# Patient Record
Sex: Male | Born: 1935
Health system: Southern US, Community
[De-identification: ages and names within clinical notes are randomized; demographics above are authoritative.]

## PROBLEM LIST (undated history)

## (undated) DIAGNOSIS — N200 Calculus of kidney: Secondary | ICD-10-CM

## (undated) DIAGNOSIS — M109 Gout, unspecified: Secondary | ICD-10-CM

## (undated) DIAGNOSIS — I219 Acute myocardial infarction, unspecified: Secondary | ICD-10-CM

## (undated) DIAGNOSIS — I10 Essential (primary) hypertension: Secondary | ICD-10-CM

## (undated) DIAGNOSIS — I499 Cardiac arrhythmia, unspecified: Secondary | ICD-10-CM

## (undated) DIAGNOSIS — C61 Malignant neoplasm of prostate: Secondary | ICD-10-CM

## (undated) HISTORY — PX: CARDIAC DEFIBRILLATOR PLACEMENT: SHX171

## (undated) HISTORY — PX: SHOULDER SURGERY: SHX246

## (undated) HISTORY — PX: PACEMAKER INSERTION: SHX728

---

## 2012-03-12 ENCOUNTER — Emergency Department (HOSPITAL_COMMUNITY)
Admission: EM | Admit: 2012-03-12 | Discharge: 2012-03-13 | Disposition: A | Discharge status: Home / Self Care | Payer: Medicare Other | Attending: Emergency Medicine | Admitting: Emergency Medicine

## 2012-03-12 ENCOUNTER — Encounter (HOSPITAL_COMMUNITY): Payer: Self-pay | Admitting: Family Medicine

## 2012-03-12 DIAGNOSIS — R918 Other nonspecific abnormal finding of lung field: Secondary | ICD-10-CM

## 2012-03-12 DIAGNOSIS — K5900 Constipation, unspecified: Secondary | ICD-10-CM

## 2012-03-12 DIAGNOSIS — Z95810 Presence of automatic (implantable) cardiac defibrillator: Secondary | ICD-10-CM | POA: Insufficient documentation

## 2012-03-12 DIAGNOSIS — I2510 Atherosclerotic heart disease of native coronary artery without angina pectoris: Secondary | ICD-10-CM | POA: Insufficient documentation

## 2012-03-12 DIAGNOSIS — I10 Essential (primary) hypertension: Secondary | ICD-10-CM | POA: Insufficient documentation

## 2012-03-12 DIAGNOSIS — Z8546 Personal history of malignant neoplasm of prostate: Secondary | ICD-10-CM | POA: Insufficient documentation

## 2012-03-12 DIAGNOSIS — R1032 Left lower quadrant pain: Secondary | ICD-10-CM | POA: Insufficient documentation

## 2012-03-12 DIAGNOSIS — D649 Anemia, unspecified: Secondary | ICD-10-CM | POA: Insufficient documentation

## 2012-03-12 DIAGNOSIS — R911 Solitary pulmonary nodule: Secondary | ICD-10-CM | POA: Insufficient documentation

## 2012-03-12 DIAGNOSIS — Z7901 Long term (current) use of anticoagulants: Secondary | ICD-10-CM | POA: Insufficient documentation

## 2012-03-12 DIAGNOSIS — N289 Disorder of kidney and ureter, unspecified: Secondary | ICD-10-CM | POA: Insufficient documentation

## 2012-03-12 DIAGNOSIS — D696 Thrombocytopenia, unspecified: Secondary | ICD-10-CM

## 2012-03-12 DIAGNOSIS — I252 Old myocardial infarction: Secondary | ICD-10-CM | POA: Insufficient documentation

## 2012-03-12 DIAGNOSIS — Z79899 Other long term (current) drug therapy: Secondary | ICD-10-CM | POA: Insufficient documentation

## 2012-03-12 HISTORY — DX: Essential (primary) hypertension: I10

## 2012-03-12 HISTORY — DX: Malignant neoplasm of prostate: C61

## 2012-03-12 LAB — COMPREHENSIVE METABOLIC PANEL
AST: 39 U/L — ABNORMAL HIGH (ref 0–37)
Albumin: 3.7 g/dL (ref 3.5–5.2)
Alkaline Phosphatase: 64 U/L (ref 39–117)
BUN: 43 mg/dL — ABNORMAL HIGH (ref 6–23)
Chloride: 102 mEq/L (ref 96–112)
Potassium: 4.9 mEq/L (ref 3.5–5.1)
Sodium: 136 mEq/L (ref 135–145)
Total Bilirubin: 0.2 mg/dL — ABNORMAL LOW (ref 0.3–1.2)
Total Protein: 7.3 g/dL (ref 6.0–8.3)

## 2012-03-12 LAB — URINALYSIS, ROUTINE W REFLEX MICROSCOPIC
Bilirubin Urine: NEGATIVE
Glucose, UA: NEGATIVE mg/dL
Ketones, ur: NEGATIVE mg/dL
Nitrite: NEGATIVE
Protein, ur: NEGATIVE mg/dL
Specific Gravity, Urine: 1.014 (ref 1.005–1.030)
Urobilinogen, UA: 0.2 mg/dL (ref 0.0–1.0)
pH: 5.5 (ref 5.0–8.0)

## 2012-03-12 LAB — CBC WITH DIFFERENTIAL/PLATELET
Basophils Absolute: 0 10*3/uL (ref 0.0–0.1)
Basophils Relative: 0 % (ref 0–1)
Eosinophils Absolute: 0.1 10*3/uL (ref 0.0–0.7)
Hemoglobin: 11.9 g/dL — ABNORMAL LOW (ref 13.0–17.0)
MCH: 24.2 pg — ABNORMAL LOW (ref 26.0–34.0)
MCHC: 31.7 g/dL (ref 30.0–36.0)
Neutro Abs: 4.5 10*3/uL (ref 1.7–7.7)
Neutrophils Relative %: 67 % (ref 43–77)
Platelets: 73 10*3/uL — ABNORMAL LOW (ref 150–400)
RDW: 16.4 % — ABNORMAL HIGH (ref 11.5–15.5)

## 2012-03-12 LAB — POCT I-STAT TROPONIN I

## 2012-03-12 LAB — URINE MICROSCOPIC-ADD ON

## 2012-03-12 LAB — LIPASE, BLOOD: Lipase: 61 U/L — ABNORMAL HIGH (ref 11–59)

## 2012-03-12 MED ORDER — FENTANYL CITRATE 0.05 MG/ML IJ SOLN
50.0000 ug | Freq: Once | INTRAMUSCULAR | Status: AC
  Administered 2012-03-12: 50 ug via INTRAVENOUS
  Filled 2012-03-12: qty 2

## 2012-03-12 MED ORDER — SODIUM CHLORIDE 0.9 % IV BOLUS (SEPSIS)
500.0000 mL | Freq: Once | INTRAVENOUS | Status: AC
  Administered 2012-03-12: 500 mL via INTRAVENOUS

## 2012-03-12 MED ORDER — SODIUM CHLORIDE 0.9 % IV SOLN: INTRAVENOUS | Status: DC

## 2012-03-12 NOTE — ED Notes (Signed)
Pt finished oral CT contrast.  CT notified.

## 2012-03-12 NOTE — ED Provider Notes (Signed)
History     CSN: [id number]  Arrival date & time 03/12/12  7829   First MD Initiated Contact with Patient 03/12/12 2124      Chief Complaint  Patient presents with  . Constipation    (Consider location/radiation/quality/duration/timing/severity/associated sxs/prior treatment) HPI 76 year old male complains of abdominal pain and constipation. He states the last couple of months instead of having one bowel movement per day he started having one about every few days. The last week he has not had good bowel movements at all and has been only able to pass several spells of tiny little rabbit pellets sized stools. He tried a laxative a few weeks ago and had a good bowel movement after that. He tried laxatives this week over-the-counter not working. He has 2 days of left lower quadrant mild but constant waxing and waning abdominal pain without nausea or vomiting diarrhea or bloody stools. He is no chest pain cough or shortness breath. Treatment prior to arrival was over-the-counter laxatives without improvement.  Pain gradually worsening mild-moderate. Past Medical History  Diagnosis Date  . Prostate cancer   . Hypertension   CAD, MI, ICD  Past Surgical History  Procedure Date  . Cardiac defibrillator placement   ICD, prostate seed implants  No family history on file.  History  Substance Use Topics  . Smoking status: Never Smoker   . Smokeless tobacco: Not on file  . Alcohol Use: No      Review of Systems 10 Systems reviewed and are negative for acute change except as noted in the HPI. Allergies  Ibuprofen  Home Medications   Current Outpatient Rx  Name  Route  Sig  Dispense  Refill  . ALLOPURINOL 300 MG PO TABS   Oral   Take 300 mg by mouth daily.         Marland Kitchen BISACODYL 5 MG PO TBEC   Oral   Take 5 mg by mouth daily as needed. For constipation         . COLCHICINE 0.6 MG PO TABS   Oral   Take 0.6 mg by mouth 2 (two) times daily.         Marland Kitchen DOXAZOSIN MESYLATE 4  MG PO TABS   Oral   Take 4 mg by mouth at bedtime.         . FOSINOPRIL SODIUM 20 MG PO TABS   Oral   Take 20 mg by mouth daily.         Marland Kitchen GABAPENTIN 300 MG PO CAPS   Oral   Take 300 mg by mouth 3 (three) times daily.         Marland Kitchen GLYCERIN (LAXATIVE) 2 G RE SUPP   Rectal   Place 1 suppository rectally daily as needed. For constipation         . HYDROCHLOROTHIAZIDE 25 MG PO TABS   Oral   Take 25 mg by mouth daily.         Marland Kitchen METOPROLOL TARTRATE 50 MG PO TABS   Oral   Take 50 mg by mouth daily.         Marland Kitchen OVER THE COUNTER MEDICATION   Oral   Take 2-4 capsules by mouth daily with breakfast. "LBS II" (lower bowel stimulant"         . POTASSIUM CHLORIDE ER 10 MEQ PO TBCR   Oral   Take 10 mEq by mouth daily.         . TORSEMIDE 20 MG PO TABS  Oral   Take 20 mg by mouth daily.         . WARFARIN SODIUM 5 MG PO TABS   Oral   Take 7.5 mg by mouth daily.         Marland Kitchen PEG 3350-KCL-NABCB-NACL-NASULF 236 G PO SOLR   Oral   Take 4,000 mLs by mouth once.   4000 mL   0     BP 133/71  Pulse 78  Temp 98 F (36.7 C) (Oral)  Resp 18  SpO2 96%  Physical Exam  Nursing note and vitals reviewed. Constitutional:       Awake, alert, nontoxic appearance.  HENT:  Head: Atraumatic.  Eyes: Right eye exhibits no discharge. Left eye exhibits no discharge.  Neck: Neck supple.  Cardiovascular: Normal rate and regular rhythm.   No murmur heard. Pulmonary/Chest: Effort normal and breath sounds normal. No respiratory distress. He has no wheezes. He has no rales. He exhibits no tenderness.  Abdominal: Soft. Bowel sounds are normal. He exhibits distension. He exhibits no mass. There is tenderness. There is no rebound and no guarding.       Abdomen mildly distended but soft with mild left lower quadrant tenderness without rebound  Genitourinary:       Testicles are nontender, no palpable inguinal hernias or inguinal tenderness, rectal examination nontender with light brown  small amount of stool without fecal impaction appreciated  Musculoskeletal: He exhibits no tenderness.       Baseline ROM, no obvious new focal weakness.  Neurological: He is alert.       Mental status and motor strength appears baseline for patient and situation.  Skin: No rash noted.  Psychiatric: He has a normal mood and affect.    ED Course  Procedures (including critical care time)  Labs Reviewed  CBC WITH DIFFERENTIAL - Abnormal; Notable for the following:    Hemoglobin 11.9 (*)     HCT 37.5 (*)     MCV 76.2 (*)     MCH 24.2 (*)     RDW 16.4 (*)     Platelets 73 (*)  PLATELET COUNT CONFIRMED BY SMEAR   All other components within normal limits  COMPREHENSIVE METABOLIC PANEL - Abnormal; Notable for the following:    Glucose, Bld 109 (*)     BUN 43 (*)     Creatinine, Ser 1.79 (*)     AST 39 (*)     Total Bilirubin 0.2 (*)     GFR calc non Af Amer 36 (*)     GFR calc Af Amer 42 (*)     All other components within normal limits  LIPASE, BLOOD - Abnormal; Notable for the following:    Lipase 61 (*)     All other components within normal limits  URINALYSIS, ROUTINE W REFLEX MICROSCOPIC - Abnormal; Notable for the following:    Hgb urine dipstick TRACE (*)     Leukocytes, UA TRACE (*)     All other components within normal limits  POCT I-STAT TROPONIN I  URINE MICROSCOPIC-ADD ON  LAB REPORT - SCANNED   Ct Abdomen Pelvis W Contrast  03/13/2012  *RADIOLOGY REPORT*  Clinical Data: Left lower quadrant abdominal pain and constipation. History of prostate cancer.  CT ABDOMEN AND PELVIS WITH CONTRAST  Technique:  Multidetector CT imaging of the abdomen and pelvis was performed following the standard protocol during bolus administration of intravenous contrast.  Contrast: 80mL OMNIPAQUE IOHEXOL 300 MG/ML  SOLN  Comparison: None.  Findings:  Three nodules are demonstrated in the right lung base, measuring up to 6 mm diameter. Six follow-up CT is recommended.  The liver, spleen,  gallbladder, pancreas, adrenal glands, and retroperitoneal lymph nodes are unremarkable.  Calcification of the aorta without aneurysm.  Multiple parenchymal cysts of the kidneys without solid mass or hydronephrosis.  The stomach, small bowel, and colon are not abnormally distended and no wall thickening is identified.  No free air or free fluid in the abdomen.  Small amount of fat in the umbilicus.  Pelvis:  Seed implants in the prostate gland.  Bladder wall is not thickened.  Pelvic lymph nodes are not pathologically enlarged. The appendix is normal.  No diverticulitis.  Degenerative changes in the lumbar spine.  No sclerotic bone lesions are appreciated.  IMPRESSION: Right lung nodules measuring up to 6 mm diameter.  28-month follow- up study recommended.  No acute process demonstrated in the abdomen or pelvis.   Original Report Authenticated By: Burman Nieves, M.D.      1. Abdominal pain, left lower quadrant   2. Constipation   3. Lung nodule, multiple   4. Renal insufficiency   5. Thrombocytopenia   6. Anemia       MDM  Pt stable in ED with no significant deterioration in condition.  Patient / Family / Caregiver informed of clinical course, understand medical decision-making process, and agree with plan.  I doubt any other EMC precluding discharge at this time including, but not necessarily limited to the following:SBI.         Hurman Horn, MD 03/14/12 1714

## 2012-03-12 NOTE — ED Notes (Signed)
Per pt sts he hasn't had a normal BM in 1 week. sts generalized abdominal pain and nausea. sts also some lower abdominal pain

## 2012-03-13 ENCOUNTER — Emergency Department (HOSPITAL_COMMUNITY): Payer: Medicare Other

## 2012-03-13 ENCOUNTER — Encounter (HOSPITAL_COMMUNITY): Payer: Self-pay | Admitting: Radiology

## 2012-03-13 MED ORDER — IOHEXOL 300 MG/ML  SOLN
80.0000 mL | Freq: Once | INTRAMUSCULAR | Status: AC | PRN
  Administered 2012-03-13: 80 mL via INTRAVENOUS

## 2012-03-13 MED ORDER — PEG 3350-KCL-NABCB-NACL-NASULF 236 G PO SOLR: 4.0000 L | Freq: Once | ORAL | Status: AC

## 2013-06-26 ENCOUNTER — Emergency Department (HOSPITAL_BASED_OUTPATIENT_CLINIC_OR_DEPARTMENT_OTHER)
Admission: EM | Admit: 2013-06-26 | Discharge: 2013-06-26 | Disposition: A | Discharge status: Home / Self Care | Payer: Medicare HMO | Attending: Emergency Medicine | Admitting: Emergency Medicine

## 2013-06-26 ENCOUNTER — Encounter (HOSPITAL_BASED_OUTPATIENT_CLINIC_OR_DEPARTMENT_OTHER): Payer: Self-pay | Admitting: Emergency Medicine

## 2013-06-26 ENCOUNTER — Emergency Department (HOSPITAL_BASED_OUTPATIENT_CLINIC_OR_DEPARTMENT_OTHER): Payer: Medicare HMO

## 2013-06-26 DIAGNOSIS — Z950 Presence of cardiac pacemaker: Secondary | ICD-10-CM | POA: Insufficient documentation

## 2013-06-26 DIAGNOSIS — S46009A Unspecified injury of muscle(s) and tendon(s) of the rotator cuff of unspecified shoulder, initial encounter: Secondary | ICD-10-CM

## 2013-06-26 DIAGNOSIS — Z9889 Other specified postprocedural states: Secondary | ICD-10-CM | POA: Insufficient documentation

## 2013-06-26 DIAGNOSIS — M25512 Pain in left shoulder: Secondary | ICD-10-CM

## 2013-06-26 DIAGNOSIS — I252 Old myocardial infarction: Secondary | ICD-10-CM | POA: Insufficient documentation

## 2013-06-26 DIAGNOSIS — Z95810 Presence of automatic (implantable) cardiac defibrillator: Secondary | ICD-10-CM | POA: Insufficient documentation

## 2013-06-26 DIAGNOSIS — R58 Hemorrhage, not elsewhere classified: Secondary | ICD-10-CM | POA: Insufficient documentation

## 2013-06-26 DIAGNOSIS — Y929 Unspecified place or not applicable: Secondary | ICD-10-CM | POA: Insufficient documentation

## 2013-06-26 DIAGNOSIS — I10 Essential (primary) hypertension: Secondary | ICD-10-CM | POA: Insufficient documentation

## 2013-06-26 DIAGNOSIS — Z79899 Other long term (current) drug therapy: Secondary | ICD-10-CM | POA: Insufficient documentation

## 2013-06-26 DIAGNOSIS — Z8546 Personal history of malignant neoplasm of prostate: Secondary | ICD-10-CM | POA: Insufficient documentation

## 2013-06-26 DIAGNOSIS — M109 Gout, unspecified: Secondary | ICD-10-CM | POA: Insufficient documentation

## 2013-06-26 DIAGNOSIS — Y9389 Activity, other specified: Secondary | ICD-10-CM | POA: Insufficient documentation

## 2013-06-26 DIAGNOSIS — X58XXXA Exposure to other specified factors, initial encounter: Secondary | ICD-10-CM | POA: Insufficient documentation

## 2013-06-26 DIAGNOSIS — S43429A Sprain of unspecified rotator cuff capsule, initial encounter: Secondary | ICD-10-CM | POA: Insufficient documentation

## 2013-06-26 DIAGNOSIS — Z7901 Long term (current) use of anticoagulants: Secondary | ICD-10-CM | POA: Insufficient documentation

## 2013-06-26 HISTORY — DX: Cardiac arrhythmia, unspecified: I49.9

## 2013-06-26 HISTORY — DX: Gout, unspecified: M10.9

## 2013-06-26 HISTORY — DX: Acute myocardial infarction, unspecified: I21.9

## 2013-06-26 MED ORDER — TRAMADOL HCL 50 MG PO TABS: 50.0000 mg | ORAL_TABLET | Freq: Four times a day (QID) | ORAL | Status: AC | PRN

## 2013-06-26 NOTE — ED Provider Notes (Signed)
CSN: [id number]     Arrival date & time 06/26/13  1957 History   First MD Initiated Contact with Patient 06/26/13 2240     This chart was scribed for Ronald Kung, MD by Forrestine Him, ED Scribe. This patient was seen in room MH02/MH02 and the patient's care was started 10:44 PM.   Chief Complaint  Patient presents with  . Shoulder Pain   Patient is a 78 y.o. male presenting with shoulder pain. The history is provided by the patient. No language interpreter was used.  Shoulder Pain This is a new problem. The current episode started more than 2 days ago. The problem occurs constantly. The problem has not changed since onset.Pertinent negatives include no chest pain, no abdominal pain, no headaches and no shortness of breath. Nothing aggravates the symptoms. Nothing relieves the symptoms. He has tried nothing for the symptoms.   HPI Comments: Ronald Nicholson is a 78 y.o. dominant right handed male who presents to the Emergency Department complaining of anterior left sided shoulder pain that initially started 5 days ago after waking from sleep. Denies any recent injury or trauma, but reports having left shoulder surgery to repair a tare about 15 years ago. States the pain is exacerbated by all movement, and he denies any alleviating factors at this time. He states he has been using a belt as a sling for support, as he is unable to move his shoulder without pain. At this time she denies any fever, chills, congestion, rhinorrhea, sore throat, visual disturbances, cough, SOB, CP, HA, dysuria, abdominal pain, diarrhea, nausea, vomiting, joint swelling, rash, or confusion.Pt is currently on Coumadin 5 mg tablets. His PMHx includes prostate cancer, HTN, heart attack, and Gout.    [facility]  Past Medical History  Diagnosis Date  . Prostate cancer   . Hypertension   . Irregular heart rate   . Heart attack   . Gout    Past Surgical History  Procedure Laterality Date   . Cardiac defibrillator placement    . Pacemaker insertion     No family history on file. History  Substance Use Topics  . Smoking status: Never Smoker   . Smokeless tobacco: Never Used  . Alcohol Use: No    Review of Systems  Constitutional: Negative for fever and chills.  HENT: Negative for congestion, rhinorrhea and sore throat.   Eyes: Negative for visual disturbance.  Respiratory: Negative for cough and shortness of breath.   Cardiovascular: Negative for chest pain.  Gastrointestinal: Negative for nausea, vomiting, abdominal pain and diarrhea.  Genitourinary: Negative for dysuria.  Musculoskeletal: Positive for arthralgias (Left shoulder pain) and joint swelling. Negative for back pain and neck pain.  Skin: Negative for rash.  Neurological: Negative for headaches.  Hematological: Bruises/bleeds easily.  Psychiatric/Behavioral: Negative for confusion.      Allergies  Ibuprofen  Home Medications   Current Outpatient Rx  Name  Route  Sig  Dispense  Refill  . allopurinol (ZYLOPRIM) 300 MG tablet   Oral   Take 300 mg by mouth daily.         . bisacodyl (DULCOLAX) 5 MG EC tablet   Oral   Take 5 mg by mouth daily as needed. For constipation         . colchicine 0.6 MG tablet   Oral   Take 0.6 mg by mouth 2 (two) times daily.         Marland Kitchen doxazosin (CARDURA) 4 MG  tablet   Oral   Take 4 mg by mouth at bedtime.         . fosinopril (MONOPRIL) 20 MG tablet   Oral   Take 20 mg by mouth daily.         Marland Kitchen gabapentin (NEURONTIN) 300 MG capsule   Oral   Take 300 mg by mouth 3 (three) times daily.         Marland Kitchen glycerin adult (GLYCERIN ADULT) 2 G SUPP   Rectal   Place 1 suppository rectally daily as needed. For constipation         . hydrochlorothiazide (HYDRODIURIL) 25 MG tablet   Oral   Take 25 mg by mouth daily.         . metoprolol (LOPRESSOR) 50 MG tablet   Oral   Take 50 mg by mouth daily.         Marland Kitchen OVER THE COUNTER MEDICATION   Oral    Take 2-4 capsules by mouth daily with breakfast. "LBS II" (lower bowel stimulant"         . potassium chloride (K-DUR) 10 MEQ tablet   Oral   Take 10 mEq by mouth daily.         Marland Kitchen torsemide (DEMADEX) 20 MG tablet   Oral   Take 20 mg by mouth daily.         Marland Kitchen warfarin (COUMADIN) 5 MG tablet   Oral   Take 7.5 mg by mouth daily.         . polyethylene glycol (GOLYTELY) 236 G solution   Oral   Take 4,000 mLs by mouth once.   4000 mL   0   . traMADol (ULTRAM) 50 MG tablet   Oral   Take 1 tablet (50 mg total) by mouth every 6 (six) hours as needed.   15 tablet   0    Triage VitalsBP 139/63  Pulse 73  Temp(Src) 98.5 F (36.9 C) (Oral)  Resp 20  Wt 230 lb (104.327 kg)  SpO2 95%  Physical Exam  Nursing note and vitals reviewed. Constitutional: He is oriented to person, place, and time. He appears well-developed and well-nourished.  HENT:  Head: Normocephalic and atraumatic.  Eyes: EOM are normal.  Neck: Normal range of motion.  Cardiovascular: Normal rate, regular rhythm and normal heart sounds.   Pulses:      Radial pulses are 2+ on the left side.  Capillary refill 1 second  Pulmonary/Chest: Effort normal and breath sounds normal.  Abdominal: Soft. Bowel sounds are normal.  Musculoskeletal: Normal range of motion. He exhibits tenderness.  Tenderness to anterior left shoulder  Neurological: He is alert and oriented to person, place, and time.  Skin: Skin is warm and dry.  Psychiatric: He has a normal mood and affect. His behavior is normal.    ED Course  Procedures (including critical care time)  DIAGNOSTIC STUDIES: Oxygen Saturation is 95% on RA, Adequate by my interpretation.    COORDINATION OF CARE: 10:54 PM- Will give Tramadol. Discussed treatment plan with pt at bedside and pt agreed to plan.     Labs Review Labs Reviewed - No data to display Imaging Review Dg Shoulder Left  06/26/2013   CLINICAL DATA:  Left shoulder pain  EXAM: LEFT SHOULDER -  2+ VIEW  COMPARISON:  DG SHOULDER 2+V*L* dated 06/23/2013  FINDINGS: The left glenohumeral joint is intact. There is calcific tendinitis again demonstrated along the humeral head. Pacemaker in place.  IMPRESSION: 1. No acute  findings. 2. Calcific tendinitis.   Electronically Signed   By: Suzy Bouchard M.D.   On: 06/26/2013 20:55    EKG Interpretation   None       MDM   Final diagnoses:  Shoulder pain, left  Rotator cuff injury    Patient with old left rotator cuff injury with surgery many years ago. Today symptoms consistent with a recurrent injury the rotator cuff. She was shoulder immobilizer and followup with sports medicine. X-rays negative for dislocation or fracture.  I personally performed the services described in this documentation, which was scribed in my presence. The recorded information has been reviewed and is accurate.    Ronald Kung, MD 06/26/13 2308

## 2013-06-26 NOTE — ED Notes (Signed)
Pt reports left shoulder pain since Tuesday- unable to move it- using belt as a sling- denies known injury

## 2013-06-26 NOTE — ED Notes (Signed)
Left shoulder immobilizer placed by ED tech per EDP order.

## 2013-06-26 NOTE — Discharge Instructions (Signed)
Wear the shoulder immobilizer on the left side for your comfort. Can take it off for showers. Move your arm left and right low that do not lifted up. Give the sports medicine or call for followup. Take the tramadol as needed for pain. Most likely just the arm in the sling will be very helpful to help control the pain.

## 2013-06-29 ENCOUNTER — Encounter: Payer: Self-pay | Admitting: Family Medicine

## 2013-06-29 ENCOUNTER — Ambulatory Visit (INDEPENDENT_AMBULATORY_CARE_PROVIDER_SITE_OTHER): Payer: Medicare HMO | Admitting: Family Medicine

## 2013-06-29 VITALS — BP 133/76 | HR 65 | Ht 69.0 in | Wt 224.0 lb

## 2013-06-29 DIAGNOSIS — M25519 Pain in unspecified shoulder: Secondary | ICD-10-CM

## 2013-06-29 DIAGNOSIS — M25512 Pain in left shoulder: Secondary | ICD-10-CM

## 2013-06-29 NOTE — Patient Instructions (Signed)
You have calcific rotator cuff tendinitis. Try to avoid painful activities (overhead activities, lifting with extended arm) as much as possible. Start arm circles, arm swings, table slides - 3 sets of 10 once a day. Subacromial injection may be beneficial to help with pain and to decrease inflammation - you were given this today. After about 5 days you can start the exercises with the band - 3 sets of 10 once a day. Follow up with me in 1 month. If not improving at follow-up we will consider further imaging, physical therapy and/or nitro patches.

## 2013-07-04 ENCOUNTER — Encounter: Payer: Self-pay | Admitting: Family Medicine

## 2013-07-04 DIAGNOSIS — M25512 Pain in left shoulder: Secondary | ICD-10-CM | POA: Insufficient documentation

## 2013-07-04 NOTE — Assessment & Plan Note (Signed)
2/2 calcific rotator cuff tendinitis, confirmed by radiographs.  Start home exercise program.  Subacromial injection today.  Start home exercise program in about 5 days.  Ok to start codman exercises now.  F/u in 1 month.  After informed written consent, patient was seated on exam table. Left shoulder was prepped with alcohol swab and utilizing posterior approach, patient's left subacromial space was injected with 3:1 marcaine: depomedrol. Patient tolerated the procedure well without immediate complications.

## 2013-07-04 NOTE — Progress Notes (Signed)
Patient ID: Ronald Nicholson, male   DOB: July 20, 1935, 78 y.o.   MRN: [id number]  PCP: Default, Provider, MD  Subjective:   HPI: Patient is a 78 y.o. male here for left shoulder pain.  Patient reports his left shoulder started hurtin on Tuesday without any trauma. Did have rotator cuff repair of this shoulder 15 years ago. Some swelling, no bruising. No change in activity level. Trouble sleeping. Hard to dress. Is right handed.  Past Medical History  Diagnosis Date  . Prostate cancer   . Hypertension   . Irregular heart rate   . Heart attack   . Gout     Current Outpatient Prescriptions on File Prior to Visit  Medication Sig Dispense Refill  . allopurinol (ZYLOPRIM) 300 MG tablet Take 300 mg by mouth daily.      . bisacodyl (DULCOLAX) 5 MG EC tablet Take 5 mg by mouth daily as needed. For constipation      . colchicine 0.6 MG tablet Take 0.6 mg by mouth 2 (two) times daily.      Marland Kitchen doxazosin (CARDURA) 4 MG tablet Take 4 mg by mouth at bedtime.      . fosinopril (MONOPRIL) 20 MG tablet Take 20 mg by mouth daily.      Marland Kitchen gabapentin (NEURONTIN) 300 MG capsule Take 300 mg by mouth 3 (three) times daily.      Marland Kitchen glycerin adult (GLYCERIN ADULT) 2 G SUPP Place 1 suppository rectally daily as needed. For constipation      . hydrochlorothiazide (HYDRODIURIL) 25 MG tablet Take 25 mg by mouth daily.      Marland Kitchen OVER THE COUNTER MEDICATION Take 2-4 capsules by mouth daily with breakfast. "LBS II" (lower bowel stimulant"      . polyethylene glycol (GOLYTELY) 236 G solution Take 4,000 mLs by mouth once.  4000 mL  0  . potassium chloride (K-DUR) 10 MEQ tablet Take 10 mEq by mouth daily.      Marland Kitchen torsemide (DEMADEX) 20 MG tablet Take 20 mg by mouth daily.      . traMADol (ULTRAM) 50 MG tablet Take 1 tablet (50 mg total) by mouth every 6 (six) hours as needed.  15 tablet  0  . warfarin (COUMADIN) 5 MG tablet Take 7.5 mg by mouth daily.       No current facility-administered medications on file prior to  visit.    Past Surgical History  Procedure Laterality Date  . Cardiac defibrillator placement    . Pacemaker insertion      Allergies  Allergen Reactions  . Ibuprofen Nausea And Vomiting    History   Social History  . Marital Status: Married    Spouse Name: N/A    Number of Children: N/A  . Years of Education: N/A   Occupational History  . Not on file.   Social History Main Topics  . Smoking status: Never Smoker   . Smokeless tobacco: Never Used  . Alcohol Use: No  . Drug Use: No  . Sexual Activity: Not on file   Other Topics Concern  . Not on file   Social History Narrative  . No narrative on file    Family History  Problem Relation Age of Onset  . Hypertension Mother   . Hypertension Father   . Hypertension Brother     BP 133/76  Pulse 65  Ht 5\' 9"  (1.753 m)  Wt 224 lb (101.606 kg)  BMI 33.06 kg/m2  Review of Systems: See HPI above.  Objective:  Physical Exam:  Gen: NAD  L shoulder: No swelling, ecchymoses.  No gross deformity. No TTP biceps tendon, AC joint. Abduction, flexion to 90 degrees.  ER to 30 degrees. Positive Hawkins, Neers. Negative Yergasons. Strength 4/5 with empty can, 5/5 with IR/ER but pain with all motions. Negative apprehension. NV intact distally.    Assessment & Plan:  1. Left shoulder pain - 2/2 calcific rotator cuff tendinitis, confirmed by radiographs.  Start home exercise program.  Subacromial injection today.  Start home exercise program in about 5 days.  Ok to start codman exercises now.  F/u in 1 month.  After informed written consent, patient was seated on exam table. Left shoulder was prepped with alcohol swab and utilizing posterior approach, patient's left subacromial space was injected with 3:1 marcaine: depomedrol. Patient tolerated the procedure well without immediate complications.

## 2013-12-01 ENCOUNTER — Emergency Department (HOSPITAL_BASED_OUTPATIENT_CLINIC_OR_DEPARTMENT_OTHER): Payer: Medicare HMO

## 2013-12-01 ENCOUNTER — Emergency Department (HOSPITAL_BASED_OUTPATIENT_CLINIC_OR_DEPARTMENT_OTHER)
Admission: EM | Admit: 2013-12-01 | Discharge: 2013-12-01 | Disposition: A | Discharge status: Home / Self Care | Payer: Medicare HMO | Attending: Emergency Medicine | Admitting: Emergency Medicine

## 2013-12-01 ENCOUNTER — Encounter (HOSPITAL_BASED_OUTPATIENT_CLINIC_OR_DEPARTMENT_OTHER): Payer: Self-pay | Admitting: Emergency Medicine

## 2013-12-01 DIAGNOSIS — Z8679 Personal history of other diseases of the circulatory system: Secondary | ICD-10-CM | POA: Diagnosis not present

## 2013-12-01 DIAGNOSIS — R51 Headache: Secondary | ICD-10-CM | POA: Insufficient documentation

## 2013-12-01 DIAGNOSIS — Z95810 Presence of automatic (implantable) cardiac defibrillator: Secondary | ICD-10-CM | POA: Insufficient documentation

## 2013-12-01 DIAGNOSIS — I10 Essential (primary) hypertension: Secondary | ICD-10-CM | POA: Diagnosis not present

## 2013-12-01 DIAGNOSIS — Z8546 Personal history of malignant neoplasm of prostate: Secondary | ICD-10-CM | POA: Diagnosis not present

## 2013-12-01 DIAGNOSIS — M109 Gout, unspecified: Secondary | ICD-10-CM | POA: Insufficient documentation

## 2013-12-01 DIAGNOSIS — Z7901 Long term (current) use of anticoagulants: Secondary | ICD-10-CM | POA: Diagnosis not present

## 2013-12-01 DIAGNOSIS — I252 Old myocardial infarction: Secondary | ICD-10-CM | POA: Diagnosis not present

## 2013-12-01 DIAGNOSIS — Z79899 Other long term (current) drug therapy: Secondary | ICD-10-CM | POA: Insufficient documentation

## 2013-12-01 DIAGNOSIS — R519 Headache, unspecified: Secondary | ICD-10-CM

## 2013-12-01 MED ORDER — HYDROCODONE-ACETAMINOPHEN 5-325 MG PO TABS
1.0000 | ORAL_TABLET | Freq: Once | ORAL | Status: AC
  Administered 2013-12-01: 1 via ORAL
  Filled 2013-12-01: qty 1

## 2013-12-01 NOTE — ED Provider Notes (Signed)
Medical screening examination/treatment/procedure(s) were conducted as a shared visit with non-physician practitioner(s) and myself.  I personally evaluated the patient during the encounter.   EKG Interpretation None      Patient is a 78 year old male with history of hypertension, cardiac disease status post pacemaker/defibrillator who presents emergency room with intermittent frontal throbbing headache is gradual onset that is in present for the past 2 months. No fevers. Head injury. No numbness, tingling or focal weakness. Patient states pain resolves with Excedrin and relaxing. He is neurologically intact on exam. Head CT negative. I feel he is safe to be discharged home. Have discussed return precautions. Patient and wife comfortable with plan.  [place], DO 12/01/13 1637

## 2013-12-01 NOTE — ED Notes (Addendum)
C/o HA x months-was seen by PCP with no relief-current HA started last week-constant to frontal-pt NAD-steady gait into triage

## 2013-12-01 NOTE — Discharge Instructions (Signed)

## 2013-12-01 NOTE — ED Provider Notes (Signed)
CSN: [id number]     Arrival date & time 12/01/13  1357 History   First MD Initiated Contact with Patient 12/01/13 1435     Chief Complaint  Patient presents with  . Headache     (Consider location/radiation/quality/duration/timing/severity/associated sxs/prior Treatment) Patient is a 78 y.o. male presenting with headaches. The history is provided by the patient. No language interpreter was used.  Headache Pain location:  Generalized Quality:  Sharp Associated symptoms: no fever, no nausea and no vomiting   Associated symptoms comment:  He reports headache that starts in the frontal area bilaterally and wraps around the entire head, somewhat worse on the right side. He has had the headache for the past 2 months, and states it is worse in the past 4 days. No fever at any time. No nausea, vomiting, ataxia, confusion or altered mental status, difficult speech.    Past Medical History  Diagnosis Date  . Prostate cancer   . Hypertension   . Irregular heart rate   . Heart attack   . Gout    Past Surgical History  Procedure Laterality Date  . Cardiac defibrillator placement    . Pacemaker insertion     Family History  Problem Relation Age of Onset  . Hypertension Mother   . Hypertension Father   . Hypertension Brother    History  Substance Use Topics  . Smoking status: Never Smoker   . Smokeless tobacco: Never Used  . Alcohol Use: No    Review of Systems  Constitutional: Negative for fever and chills.  Respiratory: Negative.   Cardiovascular: Negative.   Gastrointestinal: Negative.  Negative for nausea and vomiting.  Musculoskeletal: Negative.   Skin: Negative.   Neurological: Positive for headaches. Negative for speech difficulty and weakness.  Psychiatric/Behavioral: Negative for confusion.      Allergies  Ibuprofen  Home Medications   Prior to Admission medications   Medication Sig Start Date End Date Taking? Authorizing Provider  allopurinol (ZYLOPRIM) 300  MG tablet Take 300 mg by mouth daily.    Historical Provider, MD  bisacodyl (DULCOLAX) 5 MG EC tablet Take 5 mg by mouth daily as needed. For constipation    Historical Provider, MD  colchicine 0.6 MG tablet Take 0.6 mg by mouth 2 (two) times daily.    Historical Provider, MD  doxazosin (CARDURA) 4 MG tablet Take 4 mg by mouth at bedtime.    Historical Provider, MD  fosinopril (MONOPRIL) 20 MG tablet Take 20 mg by mouth daily.    Historical Provider, MD  gabapentin (NEURONTIN) 300 MG capsule Take 300 mg by mouth 3 (three) times daily.    Historical Provider, MD  glycerin adult (GLYCERIN ADULT) 2 G SUPP Place 1 suppository rectally daily as needed. For constipation    Historical Provider, MD  hydrochlorothiazide (HYDRODIURIL) 25 MG tablet Take 25 mg by mouth daily.    Historical Provider, MD  OVER THE COUNTER MEDICATION Take 2-4 capsules by mouth daily with breakfast. "LBS II" (lower bowel stimulant"    Historical Provider, MD  polyethylene glycol (GOLYTELY) 236 G solution Take 4,000 mLs by mouth once. 03/13/12   Babette Relic, MD  potassium chloride (K-DUR) 10 MEQ tablet Take 10 mEq by mouth daily.    Historical Provider, MD  propranolol (INDERAL) 80 MG tablet  05/19/13   Historical Provider, MD  torsemide (DEMADEX) 20 MG tablet Take 20 mg by mouth daily.    Historical Provider, MD  traMADol (ULTRAM) 50 MG tablet Take 1 tablet (50  mg total) by mouth every 6 (six) hours as needed. 06/26/13   Fredia Sorrow, MD  warfarin (COUMADIN) 5 MG tablet Take 7.5 mg by mouth daily.    Historical Provider, MD   BP 148/72  Pulse 70  Temp(Src) 98 F (36.7 C) (Oral)  Resp 18  Ht 5\' 8"  (1.727 m)  Wt 231 lb (104.781 kg)  BMI 35.13 kg/m2  SpO2 100% Physical Exam  Constitutional: He is oriented to person, place, and time. He appears well-developed and well-nourished.  HENT:  Head: Normocephalic.  Neck: Normal range of motion. Neck supple.  No carotid bruit  Cardiovascular: Normal rate and regular rhythm.    Pulmonary/Chest: Effort normal and breath sounds normal.  Abdominal: Soft. Bowel sounds are normal. There is no tenderness. There is no rebound and no guarding.  Musculoskeletal: Normal range of motion.  Neurological: He is alert and oriented to person, place, and time.  Speech clear and focused. Coordination without deficits. CN's 3-12 grossly intact.   Skin: Skin is warm and dry. No rash noted.  Psychiatric: He has a normal mood and affect.    ED Course  Procedures (including critical care time) Labs Review Labs Reviewed - No data to display  Imaging Review Ct Head Wo Contrast  12/01/2013   CLINICAL DATA:  Headache for 1 week. History of prostate cancer and high blood pressure. Defibrillator in place.  EXAM: CT HEAD WITHOUT CONTRAST  TECHNIQUE: Contiguous axial images were obtained from the base of the skull through the vertex without intravenous contrast.  COMPARISON:  09/07/2012.  FINDINGS: No intracranial hemorrhage.  No CT evidence of large acute infarct.  No intracranial mass lesion noted on this unenhanced exam.  No hydrocephalus.  No bony destructive lesion.  Paranasal sinuses which are visualized, mastoid air cells and middle ear cavities are clear.  Vascular calcifications.  Mild exophthalmos.  Nonspecific skin lesion left frontal region.  IMPRESSION: No intracranial hemorrhage or CT evidence of large acute infarct.  Please see above.   Electronically Signed   By: Chauncey Cruel M.D.   On: 12/01/2013 15:08     EKG Interpretation None      MDM   Final diagnoses:  None    1. Nonspecific headache  No neurologic findings of deficit on exam in evaluation of headache of 2 months duration. Patient is evaluated by Dr. Leonides Schanz. He is felt stable for discharge home.    Dewaine Oats, PA-C 12/01/13 1627

## 2014-03-08 ENCOUNTER — Encounter (HOSPITAL_BASED_OUTPATIENT_CLINIC_OR_DEPARTMENT_OTHER): Payer: Self-pay | Admitting: Emergency Medicine

## 2014-03-08 ENCOUNTER — Emergency Department (HOSPITAL_BASED_OUTPATIENT_CLINIC_OR_DEPARTMENT_OTHER)
Admission: EM | Admit: 2014-03-08 | Discharge: 2014-03-08 | Disposition: A | Discharge status: Home / Self Care | Payer: Medicare HMO | Attending: Emergency Medicine | Admitting: Emergency Medicine

## 2014-03-08 ENCOUNTER — Emergency Department (HOSPITAL_BASED_OUTPATIENT_CLINIC_OR_DEPARTMENT_OTHER): Payer: Medicare HMO

## 2014-03-08 DIAGNOSIS — I10 Essential (primary) hypertension: Secondary | ICD-10-CM | POA: Insufficient documentation

## 2014-03-08 DIAGNOSIS — Z95810 Presence of automatic (implantable) cardiac defibrillator: Secondary | ICD-10-CM | POA: Insufficient documentation

## 2014-03-08 DIAGNOSIS — R0602 Shortness of breath: Secondary | ICD-10-CM

## 2014-03-08 DIAGNOSIS — Z79899 Other long term (current) drug therapy: Secondary | ICD-10-CM | POA: Diagnosis not present

## 2014-03-08 DIAGNOSIS — R05 Cough: Secondary | ICD-10-CM

## 2014-03-08 DIAGNOSIS — R062 Wheezing: Secondary | ICD-10-CM

## 2014-03-08 DIAGNOSIS — Z7901 Long term (current) use of anticoagulants: Secondary | ICD-10-CM | POA: Diagnosis not present

## 2014-03-08 DIAGNOSIS — R059 Cough, unspecified: Secondary | ICD-10-CM

## 2014-03-08 DIAGNOSIS — M109 Gout, unspecified: Secondary | ICD-10-CM | POA: Diagnosis not present

## 2014-03-08 DIAGNOSIS — Z8546 Personal history of malignant neoplasm of prostate: Secondary | ICD-10-CM | POA: Insufficient documentation

## 2014-03-08 LAB — URINALYSIS, ROUTINE W REFLEX MICROSCOPIC
BILIRUBIN URINE: NEGATIVE
GLUCOSE, UA: NEGATIVE mg/dL
Ketones, ur: NEGATIVE mg/dL
Nitrite: NEGATIVE
PROTEIN: NEGATIVE mg/dL
Specific Gravity, Urine: 1.013 (ref 1.005–1.030)
Urobilinogen, UA: 0.2 mg/dL (ref 0.0–1.0)
pH: 7 (ref 5.0–8.0)

## 2014-03-08 LAB — CBC WITH DIFFERENTIAL/PLATELET
Basophils Absolute: 0.1 10*3/uL (ref 0.0–0.1)
Basophils Relative: 1 % (ref 0–1)
EOS ABS: 0.3 10*3/uL (ref 0.0–0.7)
Eosinophils Relative: 5 % (ref 0–5)
HEMATOCRIT: 39.5 % (ref 39.0–52.0)
Hemoglobin: 12.5 g/dL — ABNORMAL LOW (ref 13.0–17.0)
LYMPHS ABS: 1.4 10*3/uL (ref 0.7–4.0)
LYMPHS PCT: 22 % (ref 12–46)
MCH: 24.9 pg — AB (ref 26.0–34.0)
MCHC: 31.6 g/dL (ref 30.0–36.0)
MCV: 78.7 fL (ref 78.0–100.0)
MONO ABS: 0.5 10*3/uL (ref 0.1–1.0)
Monocytes Relative: 8 % (ref 3–12)
NEUTROS ABS: 4.1 10*3/uL (ref 1.7–7.7)
Neutrophils Relative %: 64 % (ref 43–77)
Platelets: 88 10*3/uL — ABNORMAL LOW (ref 150–400)
RBC: 5.02 MIL/uL (ref 4.22–5.81)
RDW: 15.6 % — ABNORMAL HIGH (ref 11.5–15.5)
Smear Review: DECREASED
WBC: 6.4 10*3/uL (ref 4.0–10.5)

## 2014-03-08 LAB — TROPONIN I: Troponin I: <0.3 ng/mL (ref <0.30)

## 2014-03-08 LAB — COMPREHENSIVE METABOLIC PANEL
ALT: 19 U/L (ref 0–53)
AST: 21 U/L (ref 0–37)
Albumin: 3.6 g/dL (ref 3.5–5.2)
Alkaline Phosphatase: 67 U/L (ref 39–117)
Anion gap: 13 (ref 5–15)
BUN: 45 mg/dL — ABNORMAL HIGH (ref 6–23)
CO2: 31 meq/L (ref 19–32)
Calcium: 10.2 mg/dL (ref 8.4–10.5)
Chloride: 99 mEq/L (ref 96–112)
Creatinine, Ser: 2.3 mg/dL — ABNORMAL HIGH (ref 0.50–1.35)
GFR, EST AFRICAN AMERICAN: 30 mL/min — AB (ref >90)
GFR, EST NON AFRICAN AMERICAN: 26 mL/min — AB (ref >90)
GLUCOSE: 147 mg/dL — AB (ref 70–99)
Potassium: 4.2 mEq/L (ref 3.7–5.3)
SODIUM: 143 meq/L (ref 137–147)
Total Bilirubin: 0.3 mg/dL (ref 0.3–1.2)
Total Protein: 7.4 g/dL (ref 6.0–8.3)

## 2014-03-08 LAB — URINE MICROSCOPIC-ADD ON

## 2014-03-08 LAB — PRO B NATRIURETIC PEPTIDE: Pro B Natriuretic peptide (BNP): 64.8 pg/mL (ref 0–450)

## 2014-03-08 MED ORDER — ALBUTEROL SULFATE HFA 108 (90 BASE) MCG/ACT IN AERS
1.0000 | INHALATION_SPRAY | Freq: Four times a day (QID) | RESPIRATORY_TRACT | Status: DC
  Administered 2014-03-08: 2 via RESPIRATORY_TRACT
  Filled 2014-03-08: qty 6.7

## 2014-03-08 MED ORDER — IPRATROPIUM-ALBUTEROL 0.5-2.5 (3) MG/3ML IN SOLN
3.0000 mL | Freq: Once | RESPIRATORY_TRACT | Status: AC
  Administered 2014-03-08: 3 mL via RESPIRATORY_TRACT

## 2014-03-08 MED ORDER — ALBUTEROL SULFATE HFA 108 (90 BASE) MCG/ACT IN AERS: 1.0000 | INHALATION_SPRAY | Freq: Four times a day (QID) | RESPIRATORY_TRACT | Status: AC | PRN

## 2014-03-08 MED ORDER — PREDNISONE 50 MG PO TABS: 50.0000 mg | ORAL_TABLET | Freq: Every day | ORAL | Status: AC

## 2014-03-08 MED ORDER — ALBUTEROL SULFATE (2.5 MG/3ML) 0.083% IN NEBU
INHALATION_SOLUTION | RESPIRATORY_TRACT | Status: AC
  Administered 2014-03-08: 2.5 mg via RESPIRATORY_TRACT
  Filled 2014-03-08: qty 3

## 2014-03-08 MED ORDER — ALBUTEROL SULFATE (2.5 MG/3ML) 0.083% IN NEBU
2.5000 mg | INHALATION_SOLUTION | Freq: Once | RESPIRATORY_TRACT | Status: AC
  Administered 2014-03-08: 2.5 mg via RESPIRATORY_TRACT

## 2014-03-08 MED ORDER — IPRATROPIUM-ALBUTEROL 0.5-2.5 (3) MG/3ML IN SOLN
RESPIRATORY_TRACT | Status: AC
  Administered 2014-03-08: 3 mL via RESPIRATORY_TRACT
  Filled 2014-03-08: qty 3

## 2014-03-08 NOTE — ED Notes (Signed)
Expiratory Wheezes still audible since neb treatment

## 2014-03-08 NOTE — ED Provider Notes (Signed)
CSN: [id number]     Arrival date & time 03/08/14  1010 History   First MD Initiated Contact with Patient 03/08/14 1117     Chief Complaint  Patient presents with  . Wheezing     (Consider location/radiation/quality/duration/timing/severity/associated sxs/prior Treatment) Patient is a 78 y.o. male presenting with wheezing.  Wheezing Severity:  Moderate Severity compared to prior episodes:  Similar Onset quality:  Gradual Duration:  1 week Timing:  Constant Progression:  Unchanged Chronicity:  New Relieved by:  Nothing Worsened by:  Nothing tried Ineffective treatments:  None tried Associated symptoms: cough and shortness of breath   Associated symptoms: no chest pain, no fever, no headaches, no rash, no rhinorrhea, no sore throat and no stridor     Past Medical History  Diagnosis Date  . Prostate cancer   . Hypertension   . Irregular heart rate   . Heart attack   . Gout    Past Surgical History  Procedure Laterality Date  . Cardiac defibrillator placement    . Pacemaker insertion     Family History  Problem Relation Age of Onset  . Hypertension Mother   . Hypertension Father   . Hypertension Brother    History  Substance Use Topics  . Smoking status: Never Smoker   . Smokeless tobacco: Never Used  . Alcohol Use: No    Review of Systems  Constitutional: Negative for fever and chills.  HENT: Negative for congestion, rhinorrhea and sore throat.   Eyes: Negative for photophobia and visual disturbance.  Respiratory: Positive for cough, shortness of breath and wheezing. Negative for stridor.   Cardiovascular: Negative for chest pain and leg swelling.  Gastrointestinal: Negative for nausea, vomiting, abdominal pain, diarrhea and constipation.  Endocrine: Negative for polydipsia and polyuria.  Genitourinary: Negative for dysuria and hematuria.  Musculoskeletal: Negative for arthralgias and back pain.  Skin: Negative for color change and rash.  Neurological:  Negative for dizziness, syncope, light-headedness and headaches.  Hematological: Negative for adenopathy. Does not bruise/bleed easily.  All other systems reviewed and are negative.     Allergies  Ibuprofen  Home Medications   Prior to Admission medications   Medication Sig Start Date End Date Taking? Authorizing Provider  allopurinol (ZYLOPRIM) 300 MG tablet Take 300 mg by mouth daily.    Historical Provider, MD  bisacodyl (DULCOLAX) 5 MG EC tablet Take 5 mg by mouth daily as needed. For constipation    Historical Provider, MD  colchicine 0.6 MG tablet Take 0.6 mg by mouth 2 (two) times daily.    Historical Provider, MD  doxazosin (CARDURA) 4 MG tablet Take 4 mg by mouth at bedtime.    Historical Provider, MD  fosinopril (MONOPRIL) 20 MG tablet Take 20 mg by mouth daily.    Historical Provider, MD  gabapentin (NEURONTIN) 300 MG capsule Take 300 mg by mouth 3 (three) times daily.    Historical Provider, MD  glycerin adult (GLYCERIN ADULT) 2 G SUPP Place 1 suppository rectally daily as needed. For constipation    Historical Provider, MD  hydrochlorothiazide (HYDRODIURIL) 25 MG tablet Take 25 mg by mouth daily.    Historical Provider, MD  OVER THE COUNTER MEDICATION Take 2-4 capsules by mouth daily with breakfast. "LBS II" (lower bowel stimulant"    Historical Provider, MD  polyethylene glycol (GOLYTELY) 236 G solution Take 4,000 mLs by mouth once. 03/13/12   Babette Relic, MD  potassium chloride (K-DUR) 10 MEQ tablet Take 10 mEq by mouth daily.  Historical Provider, MD  propranolol (INDERAL) 80 MG tablet  05/19/13   Historical Provider, MD  torsemide (DEMADEX) 20 MG tablet Take 20 mg by mouth daily.    Historical Provider, MD  traMADol (ULTRAM) 50 MG tablet Take 1 tablet (50 mg total) by mouth every 6 (six) hours as needed. 06/26/13   Fredia Sorrow, MD  warfarin (COUMADIN) 5 MG tablet Take 7.5 mg by mouth daily.    Historical Provider, MD   BP 131/74  Pulse 59  Temp(Src) 98 F (36.7  C) (Oral)  Resp 18  Ht 5\' 8"  (1.727 m)  Wt 238 lb (107.956 kg)  BMI 36.20 kg/m2  SpO2 97% Physical Exam  Vitals reviewed. Constitutional: He is oriented to person, place, and time. He appears well-developed and well-nourished.  HENT:  Head: Normocephalic and atraumatic.  Eyes: Conjunctivae and EOM are normal.  Neck: Normal range of motion. Neck supple.  Cardiovascular: Normal rate, regular rhythm and normal heart sounds.   Pulmonary/Chest: Effort normal. No respiratory distress. He has wheezes (bil end expiratory).  Abdominal: He exhibits no distension. There is no tenderness. There is no rebound and no guarding.  Musculoskeletal: Normal range of motion.  Neurological: He is alert and oriented to person, place, and time.  Skin: Skin is warm and dry.    ED Course  Procedures (including critical care time) Labs Review Labs Reviewed  COMPREHENSIVE METABOLIC PANEL - Abnormal; Notable for the following:    Glucose, Bld 147 (*)    BUN 45 (*)    Creatinine, Ser 2.30 (*)    GFR calc non Af Amer 26 (*)    GFR calc Af Amer 30 (*)    All other components within normal limits  CBC WITH DIFFERENTIAL - Abnormal; Notable for the following:    Hemoglobin 12.5 (*)    MCH 24.9 (*)    RDW 15.6 (*)    Platelets 88 (*)    All other components within normal limits  URINALYSIS, ROUTINE W REFLEX MICROSCOPIC - Abnormal; Notable for the following:    Hgb urine dipstick TRACE (*)    Leukocytes, UA SMALL (*)    All other components within normal limits  TROPONIN I  PRO B NATRIURETIC PEPTIDE  URINE MICROSCOPIC-ADD ON    Imaging Review Dg Chest 2 View  03/08/2014   CLINICAL DATA:  Wheezing with cough for 1 week. Shortness of breath. History of pacemaker, myocardial infarction and prostate cancer. Initial encounter.  EXAM: CHEST  2 VIEW  COMPARISON:  Radiographs 06/08/2012 and 01/07/2011 from [facility]  FINDINGS: Left subclavian AICD leads extend into the right atrium,  right ventricle and coronary sinus. The heart size is at the upper limits of normal. The mediastinal contours are stable. There is no pleural effusion or pneumothorax. Degenerative changes are present throughout the spine.  IMPRESSION: Stable chest.  No active cardiopulmonary process.   Electronically Signed   By: Camie Patience M.D.   On: 03/08/2014 11:48     EKG Interpretation None      Date: 03/08/2014  Rate: 62  Rhythm: paced  QRS Axis: normal  Intervals: QT prolonged  ST/T Wave abnormalities: No acute st changes  Conduction Disutrbances:paced  Narrative Interpretation:   Old EKG Reviewed: unchanged    MDM   Final diagnoses:  Shortness of breath    78 y.o. male with pertinent PMH of prostate ca with concerning pulm nodules, prior MI, CAD, and pacer placement presents with cough, wheezing, dyspnea x  1 week.  On arrival today, pt was found to be dyspneic and wheezing, so was given a duoneb prior to my evaluation.  On my evaluation the pt is symptomatically improved.  Mild wheezing still present, but vast improvement in symptoms.  Labs and imaging as above obtained.  Creatinine mildly elevated compared to baseline. Chest x-ray without acute consolidative process. Hemoglobin stable. Likely viral bronchitis, doubt pneumonia, ACS, CHF, other emergent process. Patient has had constant symptoms over the last week. Single troponin sufficient for ACS rule out in this case without exacerbation of symptoms over 6 hours.  Patient was given a prescription for albuterol, steroids administered in the ED, and will follow-up with PCP.  1. Shortness of breath         Debby Freiberg, MD 03/10/14 [id number]

## 2014-03-08 NOTE — ED Notes (Signed)
Wheezing and cough for a week, worsening this week, SOB.

## 2014-03-08 NOTE — ED Notes (Signed)
MD at bedside with this RN 

## 2014-03-08 NOTE — Discharge Instructions (Signed)
Cough, Adult  A cough is a reflex that helps clear your throat and airways. It can help heal the body or may be a reaction to an irritated airway. A cough may only last 2 or 3 weeks (acute) or may last more than 8 weeks (chronic).  CAUSES Acute cough:  Viral or bacterial infections. Chronic cough:  Infections.  Allergies.  Asthma.  Post-nasal drip.  Smoking.  Heartburn or acid reflux.  Some medicines.  Chronic lung problems (COPD).  Cancer. SYMPTOMS   Cough.  Fever.  Chest pain.  Increased breathing rate.  High-pitched whistling sound when breathing (wheezing).  Colored mucus that you cough up (sputum). TREATMENT   A bacterial cough may be treated with antibiotic medicine.  A viral cough must run its course and will not respond to antibiotics.  Your caregiver may recommend other treatments if you have a chronic cough. HOME CARE INSTRUCTIONS   Only take over-the-counter or prescription medicines for pain, discomfort, or fever as directed by your caregiver. Use cough suppressants only as directed by your caregiver.  Use a cold steam vaporizer or humidifier in your bedroom or home to help loosen secretions.  Sleep in a semi-upright position if your cough is worse at night.  Rest as needed.  Stop smoking if you smoke. SEEK IMMEDIATE MEDICAL CARE IF:   You have pus in your sputum.  Your cough starts to worsen.  You cannot control your cough with suppressants and are losing sleep.  You begin coughing up blood.  You have difficulty breathing.  You develop pain which is getting worse or is uncontrolled with medicine.  You have a fever. MAKE SURE YOU:   Understand these instructions.  Will watch your condition.  Will get help right away if you are not doing well or get worse. Document Released: 10/25/2010 Document Revised: 07/21/2011 Document Reviewed: 10/25/2010 [employer] Patient Information 2015 [employer]. This information is not intended  to replace advice given to you by your health care provider. Make sure you discuss any questions you have with your health care provider.  

## 2014-05-26 ENCOUNTER — Emergency Department (HOSPITAL_BASED_OUTPATIENT_CLINIC_OR_DEPARTMENT_OTHER)
Admission: EM | Admit: 2014-05-26 | Discharge: 2014-05-26 | Disposition: A | Discharge status: Home / Self Care | Payer: Medicare HMO | Attending: Emergency Medicine | Admitting: Emergency Medicine

## 2014-05-26 ENCOUNTER — Emergency Department (HOSPITAL_BASED_OUTPATIENT_CLINIC_OR_DEPARTMENT_OTHER): Payer: Medicare HMO

## 2014-05-26 ENCOUNTER — Encounter (HOSPITAL_BASED_OUTPATIENT_CLINIC_OR_DEPARTMENT_OTHER): Payer: Self-pay | Admitting: Emergency Medicine

## 2014-05-26 DIAGNOSIS — I10 Essential (primary) hypertension: Secondary | ICD-10-CM | POA: Insufficient documentation

## 2014-05-26 DIAGNOSIS — M109 Gout, unspecified: Secondary | ICD-10-CM | POA: Insufficient documentation

## 2014-05-26 DIAGNOSIS — Z7952 Long term (current) use of systemic steroids: Secondary | ICD-10-CM | POA: Diagnosis not present

## 2014-05-26 DIAGNOSIS — J209 Acute bronchitis, unspecified: Secondary | ICD-10-CM | POA: Diagnosis not present

## 2014-05-26 DIAGNOSIS — Z8546 Personal history of malignant neoplasm of prostate: Secondary | ICD-10-CM | POA: Insufficient documentation

## 2014-05-26 DIAGNOSIS — Z79899 Other long term (current) drug therapy: Secondary | ICD-10-CM | POA: Diagnosis not present

## 2014-05-26 DIAGNOSIS — I252 Old myocardial infarction: Secondary | ICD-10-CM | POA: Diagnosis not present

## 2014-05-26 DIAGNOSIS — Z95810 Presence of automatic (implantable) cardiac defibrillator: Secondary | ICD-10-CM | POA: Diagnosis not present

## 2014-05-26 DIAGNOSIS — Z7901 Long term (current) use of anticoagulants: Secondary | ICD-10-CM | POA: Diagnosis not present

## 2014-05-26 DIAGNOSIS — R0602 Shortness of breath: Secondary | ICD-10-CM | POA: Diagnosis present

## 2014-05-26 LAB — CBC WITH DIFFERENTIAL/PLATELET
Basophils Absolute: 0 10*3/uL (ref 0.0–0.1)
Basophils Relative: 0 % (ref 0–1)
EOS PCT: 0 % (ref 0–5)
Eosinophils Absolute: 0 10*3/uL (ref 0.0–0.7)
HCT: 36.4 % — ABNORMAL LOW (ref 39.0–52.0)
HEMOGLOBIN: 11.5 g/dL — AB (ref 13.0–17.0)
Lymphocytes Relative: 12 % (ref 12–46)
Lymphs Abs: 1.2 10*3/uL (ref 0.7–4.0)
MCH: 24.4 pg — ABNORMAL LOW (ref 26.0–34.0)
MCHC: 31.6 g/dL (ref 30.0–36.0)
MCV: 77.3 fL — ABNORMAL LOW (ref 78.0–100.0)
MONOS PCT: 6 % (ref 3–12)
Monocytes Absolute: 0.6 10*3/uL (ref 0.1–1.0)
NEUTROS ABS: 8.4 10*3/uL — AB (ref 1.7–7.7)
Neutrophils Relative %: 82 % — ABNORMAL HIGH (ref 43–77)
Platelets: 101 10*3/uL — ABNORMAL LOW (ref 150–400)
RBC: 4.71 MIL/uL (ref 4.22–5.81)
RDW: 14.9 % (ref 11.5–15.5)
WBC: 10.2 10*3/uL (ref 4.0–10.5)

## 2014-05-26 LAB — BASIC METABOLIC PANEL
Anion gap: 8 (ref 5–15)
BUN: 46 mg/dL — ABNORMAL HIGH (ref 6–23)
CHLORIDE: 101 meq/L (ref 96–112)
CO2: 27 mmol/L (ref 19–32)
CREATININE: 2.06 mg/dL — AB (ref 0.50–1.35)
Calcium: 9.2 mg/dL (ref 8.4–10.5)
GFR calc Af Amer: 34 mL/min — ABNORMAL LOW (ref >90)
GFR calc non Af Amer: 30 mL/min — ABNORMAL LOW (ref >90)
Glucose, Bld: 143 mg/dL — ABNORMAL HIGH (ref 70–99)
Potassium: 3.6 mmol/L (ref 3.5–5.1)
Sodium: 136 mmol/L (ref 135–145)

## 2014-05-26 LAB — BRAIN NATRIURETIC PEPTIDE: B Natriuretic Peptide: 32.2 pg/mL (ref 0.0–100.0)

## 2014-05-26 LAB — TROPONIN I

## 2014-05-26 MED ORDER — ALBUTEROL SULFATE (2.5 MG/3ML) 0.083% IN NEBU
2.5000 mg | INHALATION_SOLUTION | Freq: Once | RESPIRATORY_TRACT | Status: AC
  Administered 2014-05-26: 2.5 mg via RESPIRATORY_TRACT
  Filled 2014-05-26: qty 3

## 2014-05-26 MED ORDER — ALBUTEROL SULFATE (2.5 MG/3ML) 0.083% IN NEBU: 2.5000 mg | INHALATION_SOLUTION | RESPIRATORY_TRACT | Status: AC | PRN

## 2014-05-26 MED ORDER — ALBUTEROL SULFATE HFA 108 (90 BASE) MCG/ACT IN AERS: 1.0000 | INHALATION_SPRAY | Freq: Four times a day (QID) | RESPIRATORY_TRACT | Status: AC | PRN

## 2014-05-26 MED ORDER — METHYLPREDNISOLONE SODIUM SUCC 125 MG IJ SOLR
125.0000 mg | Freq: Once | INTRAMUSCULAR | Status: AC
  Administered 2014-05-26: 125 mg via INTRAMUSCULAR
  Filled 2014-05-26: qty 2

## 2014-05-26 MED ORDER — IPRATROPIUM BROMIDE 0.02 % IN SOLN: 0.5000 mg | Freq: Four times a day (QID) | RESPIRATORY_TRACT | Status: AC

## 2014-05-26 MED ORDER — IPRATROPIUM-ALBUTEROL 0.5-2.5 (3) MG/3ML IN SOLN
3.0000 mL | Freq: Once | RESPIRATORY_TRACT | Status: AC
Start: 2014-05-26 — End: 2014-05-26
  Administered 2014-05-26: 3 mL via RESPIRATORY_TRACT
  Filled 2014-05-26: qty 3

## 2014-05-26 MED ORDER — IPRATROPIUM-ALBUTEROL 0.5-2.5 (3) MG/3ML IN SOLN
3.0000 mL | Freq: Once | RESPIRATORY_TRACT | Status: AC
  Administered 2014-05-26: 3 mL via RESPIRATORY_TRACT
  Filled 2014-05-26: qty 3

## 2014-05-26 MED ORDER — IPRATROPIUM-ALBUTEROL 0.5-2.5 (3) MG/3ML IN SOLN
3.0000 mL | RESPIRATORY_TRACT | Status: DC
  Administered 2014-05-26: 3 mL via RESPIRATORY_TRACT
  Filled 2014-05-26: qty 3

## 2014-05-26 NOTE — Discharge Instructions (Signed)
Acute Bronchitis CALL YOUR DOCTOR'S OFFICE THIS MORNING TO MAKE ARRANGEMENTS FOR A HOME NEBULIZER MACHINE. DO NOT FILL YOUR PRESCRIPTIONS FOR ALBUTEROL AND ATROVENT LIQUIDS UNTIL YOUR DOCTOR'S OFFICE HAS HELPED YOU GET SET UP WITH A MACHINE TO USE.   Bronchitis is inflammation of the airways that extend from the windpipe into the lungs (bronchi). The inflammation often causes mucus to develop. This leads to a cough, which is the most common symptom of bronchitis.  In acute bronchitis, the condition usually develops suddenly and goes away over time, usually in a couple weeks. Smoking, allergies, and asthma can make bronchitis worse. Repeated episodes of bronchitis may cause further lung problems.  CAUSES Acute bronchitis is most often caused by the same virus that causes a cold. The virus can spread from person to person (contagious) through coughing, sneezing, and touching contaminated objects. SIGNS AND SYMPTOMS   Cough.   Fever.   Coughing up mucus.   Body aches.   Chest congestion.   Chills.   Shortness of breath.   Sore throat.  DIAGNOSIS  Acute bronchitis is usually diagnosed through a physical exam. Your health care provider will also ask you questions about your medical history. Tests, such as chest X-rays, are sometimes done to rule out other conditions.  TREATMENT  Acute bronchitis usually goes away in a couple weeks. Oftentimes, no medical treatment is necessary. Medicines are sometimes given for relief of fever or cough. Antibiotic medicines are usually not needed but may be prescribed in certain situations. In some cases, an inhaler may be recommended to help reduce shortness of breath and control the cough. A cool mist vaporizer may also be used to help thin bronchial secretions and make it easier to clear the chest.  HOME CARE INSTRUCTIONS  Get plenty of rest.   Drink enough fluids to keep your urine clear or pale yellow (unless you have a medical condition that  requires fluid restriction). Increasing fluids may help thin your respiratory secretions (sputum) and reduce chest congestion, and it will prevent dehydration.   Take medicines only as directed by your health care provider.  If you were prescribed an antibiotic medicine, finish it all even if you start to feel better.  Avoid smoking and secondhand smoke. Exposure to cigarette smoke or irritating chemicals will make bronchitis worse. If you are a smoker, consider using nicotine gum or skin patches to help control withdrawal symptoms. Quitting smoking will help your lungs heal faster.   Reduce the chances of another bout of acute bronchitis by washing your hands frequently, avoiding people with cold symptoms, and trying not to touch your hands to your mouth, nose, or eyes.   Keep all follow-up visits as directed by your health care provider.  SEEK MEDICAL CARE IF: Your symptoms do not improve after 1 week of treatment.  SEEK IMMEDIATE MEDICAL CARE IF:  You develop an increased fever or chills.   You have chest pain.   You have severe shortness of breath.  You have bloody sputum.   You develop dehydration.  You faint or repeatedly feel like you are going to pass out.  You develop repeated vomiting.  You develop a severe headache. MAKE SURE YOU:   Understand these instructions.  Will watch your condition.  Will get help right away if you are not doing well or get worse. Document Released: 06/05/2004 Document Revised: 09/12/2013 Document Reviewed: 10/19/2012 [facility] Patient Information 2015 [place], [place]. This information is not intended to replace advice given to you by  your health care provider. Make sure you discuss any questions you have with your health care provider. ° °

## 2014-05-26 NOTE — ED Notes (Signed)
Some SOB stayed at 93% Spo2

## 2014-05-26 NOTE — ED Notes (Signed)
Pt walked  o2 sats around 93-94%

## 2014-05-26 NOTE — ED Notes (Signed)
Pt c/o wheezing, cough and sob,  Was seen by his md on Wednesday but no improvement

## 2014-05-26 NOTE — ED Provider Notes (Signed)
CSN: [id number]     Arrival date & time 05/26/14  2694 History   First MD Initiated Contact with Patient 05/26/14 0539     Chief Complaint  Patient presents with  . Shortness of Breath     (Consider location/radiation/quality/duration/timing/severity/associated sxs/prior Treatment) HPI The patient reports he's had a cough since Sunday which is 5 days ago. He was seen by his family physician yesterday and started on a prednisone taper and an albuterol inhaler for bronchitis. He reports his symptoms are getting worse tonight and he felt that he was wheezing more and coughing more. He reports he felt short of breath. He denies any associated chest pain. No nausea or diaphoresis. Past Medical History  Diagnosis Date  . Prostate cancer   . Hypertension   . Irregular heart rate   . Heart attack   . Gout    Past Surgical History  Procedure Laterality Date  . Cardiac defibrillator placement    . Pacemaker insertion     Family History  Problem Relation Age of Onset  . Hypertension Mother   . Hypertension Father   . Hypertension Brother    History  Substance Use Topics  . Smoking status: Never Smoker   . Smokeless tobacco: Never Used  . Alcohol Use: No    Review of Systems 10 Systems reviewed and are negative for acute change except as noted in the HPI.    Allergies  Ibuprofen  Home Medications   Prior to Admission medications   Medication Sig Start Date End Date Taking? Authorizing Provider  albuterol (PROVENTIL HFA;VENTOLIN HFA) 108 (90 BASE) MCG/ACT inhaler Inhale 1-2 puffs into the lungs every 6 (six) hours as needed for wheezing or shortness of breath. 03/08/14  Yes Debby Freiberg, MD  allopurinol (ZYLOPRIM) 300 MG tablet Take 300 mg by mouth daily.   Yes Historical Provider, MD  bisacodyl (DULCOLAX) 5 MG EC tablet Take 5 mg by mouth daily as needed. For constipation   Yes Historical Provider, MD  colchicine 0.6 MG tablet Take 0.6 mg by mouth 2 (two) times daily.   Yes  Historical Provider, MD  doxazosin (CARDURA) 4 MG tablet Take 4 mg by mouth at bedtime.   Yes Historical Provider, MD  ferrous sulfate 325 (65 FE) MG tablet Take 325 mg by mouth daily with breakfast.   Yes Historical Provider, MD  fosinopril (MONOPRIL) 20 MG tablet Take 20 mg by mouth daily.   Yes Historical Provider, MD  hydrochlorothiazide (HYDRODIURIL) 25 MG tablet Take 25 mg by mouth daily.   Yes Historical Provider, MD  polyethylene glycol (GOLYTELY) 236 G solution Take 4,000 mLs by mouth once. 03/13/12  Yes Babette Relic, MD  potassium chloride (K-DUR) 10 MEQ tablet Take 10 mEq by mouth daily.   Yes Historical Provider, MD  predniSONE (DELTASONE) 50 MG tablet Take 1 tablet (50 mg total) by mouth daily with breakfast. 03/08/14  Yes Debby Freiberg, MD  propranolol (INDERAL) 80 MG tablet  05/19/13  Yes Historical Provider, MD  torsemide (DEMADEX) 20 MG tablet Take 20 mg by mouth daily.   Yes Historical Provider, MD  warfarin (COUMADIN) 5 MG tablet Take 7.5 mg by mouth daily.   Yes Historical Provider, MD  gabapentin (NEURONTIN) 300 MG capsule Take 300 mg by mouth 3 (three) times daily.    Historical Provider, MD  glycerin adult (GLYCERIN ADULT) 2 G SUPP Place 1 suppository rectally daily as needed. For constipation    Historical Provider, MD  OVER THE COUNTER MEDICATION  Take 2-4 capsules by mouth daily with breakfast. "LBS II" (lower bowel stimulant"    Historical Provider, MD  traMADol (ULTRAM) 50 MG tablet Take 1 tablet (50 mg total) by mouth every 6 (six) hours as needed. 06/26/13   Fredia Sorrow, MD   BP 152/67 mmHg  Pulse 64  Temp(Src) 97.7 F (36.5 C) (Oral)  Resp 20  Ht 5' 8.5" (1.74 m)  Wt 230 lb (104.327 kg)  BMI 34.46 kg/m2  SpO2 97% Physical Exam  Constitutional: He is oriented to person, place, and time.  The patient is moderately obese. He is nontoxic and alert. He is not in acute respiratory distress.  HENT:  Head: Normocephalic and atraumatic.  Eyes: EOM are normal.  Pupils are equal, round, and reactive to light.  Neck: Neck supple.  Cardiovascular: Normal rate, regular rhythm, normal heart sounds and intact distal pulses.   Pulmonary/Chest: Effort normal. No respiratory distress. He has wheezes (patient has significant expiratory  wheeze bilaterally.). He has no rales.  Abdominal: Soft. Bowel sounds are normal. He exhibits no distension. There is no tenderness.  Musculoskeletal: Normal range of motion. He exhibits no edema or tenderness.  Neurological: He is alert and oriented to person, place, and time. He has normal strength. Coordination normal. GCS eye subscore is 4. GCS verbal subscore is 5. GCS motor subscore is 6.  Skin: Skin is warm, dry and intact.  Psychiatric: He has a normal mood and affect.    ED Course  Procedures (including critical care time) Labs Review Labs Reviewed  BASIC METABOLIC PANEL - Abnormal; Notable for the following:    Glucose, Bld 143 (*)    BUN 46 (*)    Creatinine, Ser 2.06 (*)    GFR calc non Af Amer 30 (*)    GFR calc Af Amer 34 (*)    All other components within normal limits  CBC WITH DIFFERENTIAL - Abnormal; Notable for the following:    Hemoglobin 11.5 (*)    HCT 36.4 (*)    MCV 77.3 (*)    MCH 24.4 (*)    Platelets 101 (*)    Neutrophils Relative % 82 (*)    Neutro Abs 8.4 (*)    All other components within normal limits  BRAIN NATRIURETIC PEPTIDE  TROPONIN I    Imaging Review Dg Chest 2 View  05/26/2014   CLINICAL DATA:  Acute onset of shortness of breath, wheezing and cough. Initial encounter.  EXAM: CHEST  2 VIEW  COMPARISON:  Chest radiograph performed 03/08/2014  FINDINGS: The lungs are well-aerated. Pulmonary vascularity is at the upper limits of normal. There is no evidence of focal opacification, pleural effusion or pneumothorax.  The heart is mildly enlarged. A pacemaker/AICD is noted at the left chest wall, with leads ending at the right atrium, right ventricle and coronary sinus. No acute  osseous abnormalities are seen.  IMPRESSION: Mild cardiomegaly noted; lungs remain grossly clear.   Electronically Signed   By: Garald Balding M.D.   On: 05/26/2014 06:22     EKG Interpretation   Date/Time:  Friday May 26 2014 05:57:41 EST Ventricular Rate:  60 PR Interval:  132 QRS Duration: 218 QT Interval:  504 QTC Calculation: 504 R Axis:   -70 Text Interpretation:  AV dual-paced rhythm Biventricular pacemaker  detected Abnormal ECG agree. no change Confirmed by Johnney Killian, MD, Jeannie Done  [phone]) on 05/26/2014 6:40:41 AM     07:01 the patient ambulated on pulse oximetry. He maintained an oxygen saturation from  91-94%. He was able to speak in clear full sentences and tolerated it well. MDM   Final diagnoses:  Acute bronchitis with bronchospasm   At this point symptoms are consistent with bronchitis and bronchospasm. The patient responds very well to DuoNeb therapy. Patient is advised to contact his family physician this morning to get set up with a home nebulizer machine he is given an additional dose of steroids here but will continue his steroid taper. At this point there does not appear to be a pneumonia present. By history and diagnostic workup there is no evidence of acute ischemic etiology.    Charlesetta Shanks, MD 05/26/14 [phone]

## 2014-05-26 NOTE — ED Notes (Signed)
Pt reports that he went to pcp on wed for sob, was given prednisone taper, but felt like he was not improving, this am continued to feel sob and wheezing

## 2015-06-30 ENCOUNTER — Encounter (HOSPITAL_BASED_OUTPATIENT_CLINIC_OR_DEPARTMENT_OTHER): Payer: Self-pay | Admitting: Emergency Medicine

## 2015-06-30 ENCOUNTER — Emergency Department (HOSPITAL_BASED_OUTPATIENT_CLINIC_OR_DEPARTMENT_OTHER)
Admission: EM | Admit: 2015-06-30 | Discharge: 2015-06-30 | Disposition: A | Discharge status: Home / Self Care | Payer: Medicare HMO | Attending: Emergency Medicine | Admitting: Emergency Medicine

## 2015-06-30 DIAGNOSIS — Z87442 Personal history of urinary calculi: Secondary | ICD-10-CM | POA: Insufficient documentation

## 2015-06-30 DIAGNOSIS — R300 Dysuria: Secondary | ICD-10-CM | POA: Diagnosis present

## 2015-06-30 DIAGNOSIS — K068 Other specified disorders of gingiva and edentulous alveolar ridge: Secondary | ICD-10-CM | POA: Diagnosis not present

## 2015-06-30 DIAGNOSIS — Z79899 Other long term (current) drug therapy: Secondary | ICD-10-CM | POA: Diagnosis not present

## 2015-06-30 DIAGNOSIS — Z8546 Personal history of malignant neoplasm of prostate: Secondary | ICD-10-CM | POA: Insufficient documentation

## 2015-06-30 DIAGNOSIS — R319 Hematuria, unspecified: Secondary | ICD-10-CM

## 2015-06-30 DIAGNOSIS — N289 Disorder of kidney and ureter, unspecified: Secondary | ICD-10-CM | POA: Diagnosis not present

## 2015-06-30 DIAGNOSIS — M109 Gout, unspecified: Secondary | ICD-10-CM | POA: Insufficient documentation

## 2015-06-30 DIAGNOSIS — Z7901 Long term (current) use of anticoagulants: Secondary | ICD-10-CM | POA: Insufficient documentation

## 2015-06-30 DIAGNOSIS — Z7952 Long term (current) use of systemic steroids: Secondary | ICD-10-CM | POA: Diagnosis not present

## 2015-06-30 DIAGNOSIS — I10 Essential (primary) hypertension: Secondary | ICD-10-CM | POA: Insufficient documentation

## 2015-06-30 DIAGNOSIS — Z95810 Presence of automatic (implantable) cardiac defibrillator: Secondary | ICD-10-CM | POA: Diagnosis not present

## 2015-06-30 DIAGNOSIS — R310 Gross hematuria: Secondary | ICD-10-CM | POA: Diagnosis not present

## 2015-06-30 HISTORY — DX: Calculus of kidney: N20.0

## 2015-06-30 LAB — URINE MICROSCOPIC-ADD ON

## 2015-06-30 LAB — CBC
HCT: 36 % — ABNORMAL LOW (ref 39.0–52.0)
Hemoglobin: 11 g/dL — ABNORMAL LOW (ref 13.0–17.0)
MCH: 23.9 pg — ABNORMAL LOW (ref 26.0–34.0)
MCHC: 30.6 g/dL (ref 30.0–36.0)
MCV: 78.1 fL (ref 78.0–100.0)
Platelets: 105 10*3/uL — ABNORMAL LOW (ref 150–400)
RBC: 4.61 MIL/uL (ref 4.22–5.81)
RDW: 15.7 % — AB (ref 11.5–15.5)
WBC: 6.7 10*3/uL (ref 4.0–10.5)

## 2015-06-30 LAB — PROTIME-INR
INR: 2.39 — ABNORMAL HIGH (ref 0.00–1.49)
Prothrombin Time: 25.8 seconds — ABNORMAL HIGH (ref 11.6–15.2)

## 2015-06-30 LAB — URINALYSIS, ROUTINE W REFLEX MICROSCOPIC
Bilirubin Urine: NEGATIVE
GLUCOSE, UA: NEGATIVE mg/dL
Ketones, ur: NEGATIVE mg/dL
Nitrite: NEGATIVE
Protein, ur: NEGATIVE mg/dL
SPECIFIC GRAVITY, URINE: 1.008 (ref 1.005–1.030)
pH: 6 (ref 5.0–8.0)

## 2015-06-30 LAB — BASIC METABOLIC PANEL
Anion gap: 9 (ref 5–15)
BUN: 50 mg/dL — ABNORMAL HIGH (ref 6–20)
CALCIUM: 9.2 mg/dL (ref 8.9–10.3)
CO2: 28 mmol/L (ref 22–32)
CREATININE: 2.16 mg/dL — AB (ref 0.61–1.24)
Chloride: 101 mmol/L (ref 101–111)
GFR calc non Af Amer: 28 mL/min — ABNORMAL LOW (ref >60)
GFR, EST AFRICAN AMERICAN: 32 mL/min — AB (ref >60)
Glucose, Bld: 119 mg/dL — ABNORMAL HIGH (ref 65–99)
Potassium: 3.3 mmol/L — ABNORMAL LOW (ref 3.5–5.1)
SODIUM: 138 mmol/L (ref 135–145)

## 2015-06-30 MED ORDER — CEPHALEXIN 250 MG PO CAPS
500.0000 mg | ORAL_CAPSULE | Freq: Once | ORAL | Status: AC
  Administered 2015-06-30: 500 mg via ORAL
  Filled 2015-06-30: qty 2

## 2015-06-30 MED ORDER — CIPROFLOXACIN HCL 500 MG PO TABS: 500.0000 mg | ORAL_TABLET | Freq: Once | ORAL | Status: DC

## 2015-06-30 MED ORDER — CEPHALEXIN 250 MG PO CAPS: 250.0000 mg | ORAL_CAPSULE | Freq: Four times a day (QID) | ORAL | Status: AC

## 2015-06-30 NOTE — ED Provider Notes (Signed)
CSN: [id number]     Arrival date & time 06/30/15  [id number] History  By signing my name below, I, Randa Evens, attest that this documentation has been prepared under the direction and in the presence of Alfonzo Beers, MD. Electronically Signed: Randa Evens, ED Scribe. 06/30/2015. 9:19 PM.    Chief Complaint  Patient presents with  . Dysuria   Patient is a 80 y.o. male presenting with dysuria. The history is provided by the patient. No language interpreter was used.  Dysuria This is a new problem. The current episode started 6 to 12 hours ago. Nothing aggravates the symptoms. Nothing relieves the symptoms. He has tried nothing for the symptoms.   HPI Comments: Ronald Nicholson is a 80 y.o. male who presents to the Emergency Department complaining of dysuria and hematuria onset today. Pt states that prior to the blood being present he had to strain when urinating. Pt doesn't report any treatments tried PTA. Denies fever, back pain, vomiting or weakness. Pt denies HX of UTI's. Pt states that he is on coumadin for PE. Pt states that he has noticed some intermittent bleeding in his gums. Pt states that his last INR was 2.5 on Wednesday. Pt reports Hx of prostate cancer.  No difficulty urinating.  No abdominal pain or flank pain associated.    Past Medical History  Diagnosis Date  . Prostate cancer ([place])   . Hypertension   . Irregular heart rate   . Heart attack ([place])   . Gout   . Kidney stones    Past Surgical History  Procedure Laterality Date  . Cardiac defibrillator placement    . Pacemaker insertion    . Cardiac defibrillator placement    . Shoulder surgery     Family History  Problem Relation Age of Onset  . Hypertension Mother   . Hypertension Father   . Hypertension Brother    Social History  Substance Use Topics  . Smoking status: Never Smoker   . Smokeless tobacco: Never Used  . Alcohol Use: No    Review of Systems  Constitutional: Negative for fever.   Gastrointestinal: Negative for vomiting.  Genitourinary: Positive for dysuria and hematuria.  Musculoskeletal: Negative for back pain.  Neurological: Negative for weakness.  All other systems reviewed and are negative.     Allergies  Ibuprofen  Home Medications   Prior to Admission medications   Medication Sig Start Date End Date Taking? Authorizing Provider  albuterol (PROVENTIL HFA;VENTOLIN HFA) 108 (90 BASE) MCG/ACT inhaler Inhale 1-2 puffs into the lungs every 6 (six) hours as needed for wheezing or shortness of breath. 03/08/14   Debby Freiberg, MD  albuterol (PROVENTIL HFA;VENTOLIN HFA) 108 (90 BASE) MCG/ACT inhaler Inhale 1-2 puffs into the lungs every 6 (six) hours as needed for wheezing or shortness of breath. 05/26/14   Charlesetta Shanks, MD  albuterol (PROVENTIL) (2.5 MG/3ML) 0.083% nebulizer solution Take 3 mLs (2.5 mg total) by nebulization every 4 (four) hours as needed for wheezing or shortness of breath. 05/26/14   Charlesetta Shanks, MD  allopurinol (ZYLOPRIM) 300 MG tablet Take 300 mg by mouth daily.    Historical Provider, MD  bisacodyl (DULCOLAX) 5 MG EC tablet Take 5 mg by mouth daily as needed. For constipation    Historical Provider, MD  cephALEXin (KEFLEX) 250 MG capsule Take 1 capsule (250 mg total) by mouth 4 (four) times daily. 06/30/15   Alfonzo Beers, MD  colchicine 0.6 MG tablet Take 0.6 mg by mouth 2 (two) times daily.  Historical Provider, MD  doxazosin (CARDURA) 4 MG tablet Take 4 mg by mouth at bedtime.    Historical Provider, MD  ferrous sulfate 325 (65 FE) MG tablet Take 325 mg by mouth daily with breakfast.    Historical Provider, MD  fosinopril (MONOPRIL) 20 MG tablet Take 20 mg by mouth daily.    Historical Provider, MD  gabapentin (NEURONTIN) 300 MG capsule Take 300 mg by mouth 3 (three) times daily.    Historical Provider, MD  glycerin adult (GLYCERIN ADULT) 2 G SUPP Place 1 suppository rectally daily as needed. For constipation    Historical Provider, MD   hydrochlorothiazide (HYDRODIURIL) 25 MG tablet Take 25 mg by mouth daily.    Historical Provider, MD  ipratropium (ATROVENT) 0.02 % nebulizer solution Take 2.5 mLs (0.5 mg total) by nebulization 4 (four) times daily. 05/26/14   Charlesetta Shanks, MD  OVER THE COUNTER MEDICATION Take 2-4 capsules by mouth daily with breakfast. "LBS II" (lower bowel stimulant"    Historical Provider, MD  polyethylene glycol (GOLYTELY) 236 G solution Take 4,000 mLs by mouth once. 03/13/12   Riki Altes, MD  potassium chloride (K-DUR) 10 MEQ tablet Take 10 mEq by mouth daily.    Historical Provider, MD  predniSONE (DELTASONE) 50 MG tablet Take 1 tablet (50 mg total) by mouth daily with breakfast. 03/08/14   Debby Freiberg, MD  propranolol (INDERAL) 80 MG tablet  05/19/13   Historical Provider, MD  torsemide (DEMADEX) 20 MG tablet Take 20 mg by mouth daily.    Historical Provider, MD  traMADol (ULTRAM) 50 MG tablet Take 1 tablet (50 mg total) by mouth every 6 (six) hours as needed. 06/26/13   Fredia Sorrow, MD  warfarin (COUMADIN) 5 MG tablet Take 7.5 mg by mouth daily.    Historical Provider, MD   BP 148/73 mmHg  Pulse 60  Temp(Src) 98.3 F (36.8 C) (Oral)  Resp 16  Ht 5' 8.5" (1.74 m)  Wt 235 lb (106.595 kg)  BMI 35.21 kg/m2  SpO2 96%  Vitals reviewed Physical Exam  Physical Examination: General appearance - alert, well appearing, and in no distress Mental status - alert, oriented to person, place, and time Eyes - no conjunctival injection no scleral icterus Mouth - mucous membranes moist, pharynx normal without lesions Chest - clear to auscultation, no wheezes, rales or rhonchi, symmetric air entry Heart - normal rate, regular rhythm, normal S1, S2, no murmurs, rubs, clicks or gallops Abdomen - soft, nontender, nondistended, no masses or organomegaly Neurological - alert, oriented, normal speech Extremities - peripheral pulses normal, no pedal edema, no clubbing or cyanosis Skin - normal coloration and  turgor, no rashes  ED Course  Procedures (including critical care time) DIAGNOSTIC STUDIES: Oxygen Saturation is 96% on RA, normal by my interpretation.    COORDINATION OF CARE: 9:13 PM-Discussed treatment plan with pt at bedside and pt agreed to plan.     Labs Review Labs Reviewed  URINALYSIS, ROUTINE W REFLEX MICROSCOPIC (NOT AT [facility]) - Abnormal; Notable for the following:    Hgb urine dipstick LARGE (*)    Leukocytes, UA MODERATE (*)    All other components within normal limits  URINE MICROSCOPIC-ADD ON - Abnormal; Notable for the following:    Squamous Epithelial / LPF 0-5 (*)    Bacteria, UA FEW (*)    All other components within normal limits  CBC - Abnormal; Notable for the following:    Hemoglobin 11.0 (*)    HCT 36.0 (*)  MCH 23.9 (*)    RDW 15.7 (*)    Platelets 105 (*)    All other components within normal limits  BASIC METABOLIC PANEL - Abnormal; Notable for the following:    Potassium 3.3 (*)    Glucose, Bld 119 (*)    BUN 50 (*)    Creatinine, Ser 2.16 (*)    GFR calc non Af Amer 28 (*)    GFR calc Af Amer 32 (*)    All other components within normal limits  PROTIME-INR - Abnormal; Notable for the following:    Prothrombin Time 25.8 (*)    INR 2.39 (*)    All other components within normal limits  URINE CULTURE    Imaging Review No results found.    EKG Interpretation None      MDM   Final diagnoses:  Hematuria   Pt presenting with c/o dysuria and gross hematuria.  Pt is on coumadin- INR is therapeutic.  Pt has some renal insufficiency but potassium is reassuring- creatinine is at/near his baseline per chart review.  .  Platelets are 100- this is near his baseline.  Urine culture pending.  Will start on keflex for possible UTI. Pt has no urinary retention.  Advised increase po fluids and followup with urology- will need further evaluation of hematuria and also creatinine recheck.  Discharged with strict return precautions.  Pt agreeable with  plan.  I personally performed the services described in this documentation, which was scribed in my presence. The recorded information has been reviewed and is accurate.       Alfonzo Beers, MD 06/30/15 [phone]

## 2015-06-30 NOTE — Discharge Instructions (Signed)
Return to the ED with any concerns including vomiting and not able to keep down liquids or medications, fever/chills, not able to urinate, abdominal or flank pain, decreased level of alertness/lethargy, or any other alarming symptoms

## 2015-06-30 NOTE — ED Notes (Signed)
Dysuria and  Hematuria since 2pm today.  No flank or abd pain.  Hx of kidney stones 15 yrs ago.

## 2015-06-30 NOTE — ED Notes (Signed)
Dr. Linker at [facility] 

## 2015-07-03 LAB — URINE CULTURE

## 2015-07-13 DIAGNOSIS — R310 Gross hematuria: Secondary | ICD-10-CM | POA: Diagnosis not present

## 2015-07-13 DIAGNOSIS — C61 Malignant neoplasm of prostate: Secondary | ICD-10-CM | POA: Diagnosis not present

## 2015-07-18 DIAGNOSIS — I2699 Other pulmonary embolism without acute cor pulmonale: Secondary | ICD-10-CM | POA: Diagnosis not present

## 2015-07-18 DIAGNOSIS — Z7901 Long term (current) use of anticoagulants: Secondary | ICD-10-CM | POA: Diagnosis not present

## 2015-07-20 DIAGNOSIS — I10 Essential (primary) hypertension: Secondary | ICD-10-CM | POA: Diagnosis not present

## 2015-07-20 DIAGNOSIS — Z8546 Personal history of malignant neoplasm of prostate: Secondary | ICD-10-CM | POA: Diagnosis not present

## 2015-07-20 DIAGNOSIS — N289 Disorder of kidney and ureter, unspecified: Secondary | ICD-10-CM | POA: Diagnosis not present

## 2015-07-20 DIAGNOSIS — M109 Gout, unspecified: Secondary | ICD-10-CM | POA: Diagnosis not present

## 2015-07-20 DIAGNOSIS — I509 Heart failure, unspecified: Secondary | ICD-10-CM | POA: Diagnosis not present

## 2015-07-20 DIAGNOSIS — E119 Type 2 diabetes mellitus without complications: Secondary | ICD-10-CM | POA: Diagnosis not present

## 2015-07-20 DIAGNOSIS — E669 Obesity, unspecified: Secondary | ICD-10-CM | POA: Diagnosis not present

## 2015-07-20 DIAGNOSIS — E559 Vitamin D deficiency, unspecified: Secondary | ICD-10-CM | POA: Diagnosis not present

## 2015-07-20 DIAGNOSIS — E785 Hyperlipidemia, unspecified: Secondary | ICD-10-CM | POA: Diagnosis not present

## 2015-07-24 DIAGNOSIS — R310 Gross hematuria: Secondary | ICD-10-CM | POA: Diagnosis not present

## 2015-07-24 DIAGNOSIS — C61 Malignant neoplasm of prostate: Secondary | ICD-10-CM | POA: Diagnosis not present

## 2015-07-24 DIAGNOSIS — N289 Disorder of kidney and ureter, unspecified: Secondary | ICD-10-CM | POA: Diagnosis not present

## 2015-07-24 DIAGNOSIS — N359 Urethral stricture, unspecified: Secondary | ICD-10-CM | POA: Diagnosis not present

## 2015-07-25 DIAGNOSIS — I2699 Other pulmonary embolism without acute cor pulmonale: Secondary | ICD-10-CM | POA: Diagnosis not present

## 2015-07-25 DIAGNOSIS — Z7901 Long term (current) use of anticoagulants: Secondary | ICD-10-CM | POA: Diagnosis not present

## 2015-08-01 DIAGNOSIS — Z7901 Long term (current) use of anticoagulants: Secondary | ICD-10-CM | POA: Diagnosis not present

## 2015-08-01 DIAGNOSIS — I2699 Other pulmonary embolism without acute cor pulmonale: Secondary | ICD-10-CM | POA: Diagnosis not present

## 2015-08-08 DIAGNOSIS — I2699 Other pulmonary embolism without acute cor pulmonale: Secondary | ICD-10-CM | POA: Diagnosis not present

## 2015-08-08 DIAGNOSIS — Z7901 Long term (current) use of anticoagulants: Secondary | ICD-10-CM | POA: Diagnosis not present

## 2015-08-15 DIAGNOSIS — Z7901 Long term (current) use of anticoagulants: Secondary | ICD-10-CM | POA: Diagnosis not present

## 2015-08-15 DIAGNOSIS — I2699 Other pulmonary embolism without acute cor pulmonale: Secondary | ICD-10-CM | POA: Diagnosis not present

## 2015-08-22 DIAGNOSIS — I2699 Other pulmonary embolism without acute cor pulmonale: Secondary | ICD-10-CM | POA: Diagnosis not present

## 2015-08-22 DIAGNOSIS — Z7901 Long term (current) use of anticoagulants: Secondary | ICD-10-CM | POA: Diagnosis not present

## 2015-08-29 DIAGNOSIS — Z7901 Long term (current) use of anticoagulants: Secondary | ICD-10-CM | POA: Diagnosis not present

## 2015-08-29 DIAGNOSIS — I2699 Other pulmonary embolism without acute cor pulmonale: Secondary | ICD-10-CM | POA: Diagnosis not present

## 2015-09-05 DIAGNOSIS — I2699 Other pulmonary embolism without acute cor pulmonale: Secondary | ICD-10-CM | POA: Diagnosis not present

## 2015-09-05 DIAGNOSIS — Z7901 Long term (current) use of anticoagulants: Secondary | ICD-10-CM | POA: Diagnosis not present

## 2015-09-06 DIAGNOSIS — C61 Malignant neoplasm of prostate: Secondary | ICD-10-CM | POA: Diagnosis not present

## 2015-09-19 DIAGNOSIS — Z7901 Long term (current) use of anticoagulants: Secondary | ICD-10-CM | POA: Diagnosis not present

## 2015-09-19 DIAGNOSIS — I2699 Other pulmonary embolism without acute cor pulmonale: Secondary | ICD-10-CM | POA: Diagnosis not present

## 2015-09-26 DIAGNOSIS — I2699 Other pulmonary embolism without acute cor pulmonale: Secondary | ICD-10-CM | POA: Diagnosis not present

## 2015-09-26 DIAGNOSIS — Z7901 Long term (current) use of anticoagulants: Secondary | ICD-10-CM | POA: Diagnosis not present

## 2015-10-03 DIAGNOSIS — Z7901 Long term (current) use of anticoagulants: Secondary | ICD-10-CM | POA: Diagnosis not present

## 2015-10-03 DIAGNOSIS — I2699 Other pulmonary embolism without acute cor pulmonale: Secondary | ICD-10-CM | POA: Diagnosis not present

## 2015-10-10 DIAGNOSIS — I2699 Other pulmonary embolism without acute cor pulmonale: Secondary | ICD-10-CM | POA: Diagnosis not present

## 2015-10-10 DIAGNOSIS — Z7901 Long term (current) use of anticoagulants: Secondary | ICD-10-CM | POA: Diagnosis not present

## 2015-10-17 DIAGNOSIS — Z7901 Long term (current) use of anticoagulants: Secondary | ICD-10-CM | POA: Diagnosis not present

## 2015-10-17 DIAGNOSIS — I2699 Other pulmonary embolism without acute cor pulmonale: Secondary | ICD-10-CM | POA: Diagnosis not present

## 2015-10-24 DIAGNOSIS — I2699 Other pulmonary embolism without acute cor pulmonale: Secondary | ICD-10-CM | POA: Diagnosis not present

## 2015-10-24 DIAGNOSIS — Z7901 Long term (current) use of anticoagulants: Secondary | ICD-10-CM | POA: Diagnosis not present

## 2015-10-31 DIAGNOSIS — I2699 Other pulmonary embolism without acute cor pulmonale: Secondary | ICD-10-CM | POA: Diagnosis not present

## 2015-10-31 DIAGNOSIS — Z7901 Long term (current) use of anticoagulants: Secondary | ICD-10-CM | POA: Diagnosis not present

## 2015-11-07 DIAGNOSIS — Z7901 Long term (current) use of anticoagulants: Secondary | ICD-10-CM | POA: Diagnosis not present

## 2015-11-07 DIAGNOSIS — M79609 Pain in unspecified limb: Secondary | ICD-10-CM | POA: Diagnosis not present

## 2015-11-07 DIAGNOSIS — R0989 Other specified symptoms and signs involving the circulatory and respiratory systems: Secondary | ICD-10-CM | POA: Diagnosis not present

## 2015-11-07 DIAGNOSIS — I2699 Other pulmonary embolism without acute cor pulmonale: Secondary | ICD-10-CM | POA: Diagnosis not present

## 2015-11-07 DIAGNOSIS — E119 Type 2 diabetes mellitus without complications: Secondary | ICD-10-CM | POA: Diagnosis not present

## 2015-11-14 DIAGNOSIS — Z95810 Presence of automatic (implantable) cardiac defibrillator: Secondary | ICD-10-CM | POA: Diagnosis not present

## 2015-11-14 DIAGNOSIS — I5022 Chronic systolic (congestive) heart failure: Secondary | ICD-10-CM | POA: Diagnosis not present

## 2015-11-14 DIAGNOSIS — Z7901 Long term (current) use of anticoagulants: Secondary | ICD-10-CM | POA: Diagnosis not present

## 2015-11-14 DIAGNOSIS — I2699 Other pulmonary embolism without acute cor pulmonale: Secondary | ICD-10-CM | POA: Diagnosis not present

## 2015-11-19 DIAGNOSIS — G629 Polyneuropathy, unspecified: Secondary | ICD-10-CM | POA: Diagnosis not present

## 2015-11-19 DIAGNOSIS — I10 Essential (primary) hypertension: Secondary | ICD-10-CM | POA: Diagnosis not present

## 2015-11-19 DIAGNOSIS — I509 Heart failure, unspecified: Secondary | ICD-10-CM | POA: Diagnosis not present

## 2015-11-19 DIAGNOSIS — M109 Gout, unspecified: Secondary | ICD-10-CM | POA: Diagnosis not present

## 2015-11-19 DIAGNOSIS — E785 Hyperlipidemia, unspecified: Secondary | ICD-10-CM | POA: Diagnosis not present

## 2015-11-19 DIAGNOSIS — D649 Anemia, unspecified: Secondary | ICD-10-CM | POA: Diagnosis not present

## 2015-11-19 DIAGNOSIS — E119 Type 2 diabetes mellitus without complications: Secondary | ICD-10-CM | POA: Diagnosis not present

## 2015-11-21 DIAGNOSIS — Z7901 Long term (current) use of anticoagulants: Secondary | ICD-10-CM | POA: Diagnosis not present

## 2015-11-21 DIAGNOSIS — I2699 Other pulmonary embolism without acute cor pulmonale: Secondary | ICD-10-CM | POA: Diagnosis not present

## 2015-11-28 DIAGNOSIS — I2699 Other pulmonary embolism without acute cor pulmonale: Secondary | ICD-10-CM | POA: Diagnosis not present

## 2015-11-28 DIAGNOSIS — Z7901 Long term (current) use of anticoagulants: Secondary | ICD-10-CM | POA: Diagnosis not present

## 2015-11-30 DIAGNOSIS — Z7901 Long term (current) use of anticoagulants: Secondary | ICD-10-CM | POA: Diagnosis not present

## 2015-11-30 DIAGNOSIS — I2699 Other pulmonary embolism without acute cor pulmonale: Secondary | ICD-10-CM | POA: Diagnosis not present

## 2015-12-05 DIAGNOSIS — Z7901 Long term (current) use of anticoagulants: Secondary | ICD-10-CM | POA: Diagnosis not present

## 2015-12-05 DIAGNOSIS — I2699 Other pulmonary embolism without acute cor pulmonale: Secondary | ICD-10-CM | POA: Diagnosis not present

## 2015-12-06 DIAGNOSIS — C61 Malignant neoplasm of prostate: Secondary | ICD-10-CM | POA: Diagnosis not present

## 2015-12-12 DIAGNOSIS — I2699 Other pulmonary embolism without acute cor pulmonale: Secondary | ICD-10-CM | POA: Diagnosis not present

## 2015-12-12 DIAGNOSIS — Z7901 Long term (current) use of anticoagulants: Secondary | ICD-10-CM | POA: Diagnosis not present

## 2015-12-14 DIAGNOSIS — R339 Retention of urine, unspecified: Secondary | ICD-10-CM | POA: Diagnosis not present

## 2015-12-14 DIAGNOSIS — N289 Disorder of kidney and ureter, unspecified: Secondary | ICD-10-CM | POA: Diagnosis not present

## 2015-12-19 DIAGNOSIS — I2699 Other pulmonary embolism without acute cor pulmonale: Secondary | ICD-10-CM | POA: Diagnosis not present

## 2015-12-19 DIAGNOSIS — Z7901 Long term (current) use of anticoagulants: Secondary | ICD-10-CM | POA: Diagnosis not present

## 2015-12-26 DIAGNOSIS — Z7901 Long term (current) use of anticoagulants: Secondary | ICD-10-CM | POA: Diagnosis not present

## 2015-12-26 DIAGNOSIS — I2699 Other pulmonary embolism without acute cor pulmonale: Secondary | ICD-10-CM | POA: Diagnosis not present

## 2015-12-27 DIAGNOSIS — E559 Vitamin D deficiency, unspecified: Secondary | ICD-10-CM | POA: Diagnosis not present

## 2015-12-27 DIAGNOSIS — N189 Chronic kidney disease, unspecified: Secondary | ICD-10-CM | POA: Diagnosis not present

## 2015-12-27 DIAGNOSIS — E669 Obesity, unspecified: Secondary | ICD-10-CM | POA: Diagnosis not present

## 2015-12-27 DIAGNOSIS — Z7901 Long term (current) use of anticoagulants: Secondary | ICD-10-CM | POA: Diagnosis not present

## 2015-12-27 DIAGNOSIS — H8110 Benign paroxysmal vertigo, unspecified ear: Secondary | ICD-10-CM | POA: Diagnosis not present

## 2015-12-27 DIAGNOSIS — D508 Other iron deficiency anemias: Secondary | ICD-10-CM | POA: Diagnosis not present

## 2015-12-27 DIAGNOSIS — E785 Hyperlipidemia, unspecified: Secondary | ICD-10-CM | POA: Diagnosis not present

## 2015-12-27 DIAGNOSIS — Z95810 Presence of automatic (implantable) cardiac defibrillator: Secondary | ICD-10-CM | POA: Diagnosis not present

## 2015-12-27 DIAGNOSIS — E1165 Type 2 diabetes mellitus with hyperglycemia: Secondary | ICD-10-CM | POA: Diagnosis not present

## 2015-12-28 DIAGNOSIS — Z7901 Long term (current) use of anticoagulants: Secondary | ICD-10-CM | POA: Diagnosis not present

## 2015-12-28 DIAGNOSIS — I2699 Other pulmonary embolism without acute cor pulmonale: Secondary | ICD-10-CM | POA: Diagnosis not present

## 2015-12-31 DIAGNOSIS — Z95810 Presence of automatic (implantable) cardiac defibrillator: Secondary | ICD-10-CM | POA: Diagnosis not present

## 2015-12-31 DIAGNOSIS — R918 Other nonspecific abnormal finding of lung field: Secondary | ICD-10-CM | POA: Diagnosis not present

## 2015-12-31 DIAGNOSIS — M5134 Other intervertebral disc degeneration, thoracic region: Secondary | ICD-10-CM | POA: Diagnosis not present

## 2015-12-31 DIAGNOSIS — Z01818 Encounter for other preprocedural examination: Secondary | ICD-10-CM | POA: Diagnosis not present

## 2016-01-01 DIAGNOSIS — N359 Urethral stricture, unspecified: Secondary | ICD-10-CM | POA: Diagnosis not present

## 2016-01-02 DIAGNOSIS — Z95810 Presence of automatic (implantable) cardiac defibrillator: Secondary | ICD-10-CM | POA: Diagnosis not present

## 2016-01-02 DIAGNOSIS — T82111A Breakdown (mechanical) of cardiac pulse generator (battery), initial encounter: Secondary | ICD-10-CM | POA: Diagnosis not present

## 2016-01-02 DIAGNOSIS — D509 Iron deficiency anemia, unspecified: Secondary | ICD-10-CM | POA: Diagnosis not present

## 2016-01-02 DIAGNOSIS — E785 Hyperlipidemia, unspecified: Secondary | ICD-10-CM | POA: Diagnosis not present

## 2016-01-02 DIAGNOSIS — Z79899 Other long term (current) drug therapy: Secondary | ICD-10-CM | POA: Diagnosis not present

## 2016-01-02 DIAGNOSIS — Z4502 Encounter for adjustment and management of automatic implantable cardiac defibrillator: Secondary | ICD-10-CM | POA: Diagnosis not present

## 2016-01-02 DIAGNOSIS — I509 Heart failure, unspecified: Secondary | ICD-10-CM | POA: Diagnosis not present

## 2016-01-02 DIAGNOSIS — I255 Ischemic cardiomyopathy: Secondary | ICD-10-CM | POA: Diagnosis not present

## 2016-01-02 DIAGNOSIS — Z7901 Long term (current) use of anticoagulants: Secondary | ICD-10-CM | POA: Diagnosis not present

## 2016-01-02 DIAGNOSIS — E1122 Type 2 diabetes mellitus with diabetic chronic kidney disease: Secondary | ICD-10-CM | POA: Diagnosis not present

## 2016-01-02 DIAGNOSIS — N189 Chronic kidney disease, unspecified: Secondary | ICD-10-CM | POA: Diagnosis not present

## 2016-01-02 DIAGNOSIS — I130 Hypertensive heart and chronic kidney disease with heart failure and stage 1 through stage 4 chronic kidney disease, or unspecified chronic kidney disease: Secondary | ICD-10-CM | POA: Diagnosis not present

## 2016-01-02 DIAGNOSIS — I501 Left ventricular failure: Secondary | ICD-10-CM | POA: Diagnosis not present

## 2016-01-06 DIAGNOSIS — I482 Chronic atrial fibrillation: Secondary | ICD-10-CM | POA: Diagnosis not present

## 2016-01-06 DIAGNOSIS — J9811 Atelectasis: Secondary | ICD-10-CM | POA: Diagnosis not present

## 2016-01-06 DIAGNOSIS — E785 Hyperlipidemia, unspecified: Secondary | ICD-10-CM | POA: Diagnosis not present

## 2016-01-06 DIAGNOSIS — E1165 Type 2 diabetes mellitus with hyperglycemia: Secondary | ICD-10-CM | POA: Diagnosis not present

## 2016-01-06 DIAGNOSIS — I10 Essential (primary) hypertension: Secondary | ICD-10-CM | POA: Diagnosis not present

## 2016-01-06 DIAGNOSIS — I130 Hypertensive heart and chronic kidney disease with heart failure and stage 1 through stage 4 chronic kidney disease, or unspecified chronic kidney disease: Secondary | ICD-10-CM | POA: Diagnosis not present

## 2016-01-06 DIAGNOSIS — K7460 Unspecified cirrhosis of liver: Secondary | ICD-10-CM | POA: Diagnosis not present

## 2016-01-06 DIAGNOSIS — I358 Other nonrheumatic aortic valve disorders: Secondary | ICD-10-CM | POA: Diagnosis not present

## 2016-01-06 DIAGNOSIS — K819 Cholecystitis, unspecified: Secondary | ICD-10-CM | POA: Diagnosis not present

## 2016-01-06 DIAGNOSIS — I509 Heart failure, unspecified: Secondary | ICD-10-CM | POA: Diagnosis not present

## 2016-01-06 DIAGNOSIS — Z01810 Encounter for preprocedural cardiovascular examination: Secondary | ICD-10-CM | POA: Diagnosis not present

## 2016-01-06 DIAGNOSIS — Z86711 Personal history of pulmonary embolism: Secondary | ICD-10-CM | POA: Diagnosis not present

## 2016-01-06 DIAGNOSIS — I5022 Chronic systolic (congestive) heart failure: Secondary | ICD-10-CM | POA: Diagnosis not present

## 2016-01-06 DIAGNOSIS — Z95810 Presence of automatic (implantable) cardiac defibrillator: Secondary | ICD-10-CM | POA: Diagnosis not present

## 2016-01-06 DIAGNOSIS — I4891 Unspecified atrial fibrillation: Secondary | ICD-10-CM | POA: Diagnosis not present

## 2016-01-06 DIAGNOSIS — N189 Chronic kidney disease, unspecified: Secondary | ICD-10-CM | POA: Diagnosis not present

## 2016-01-06 DIAGNOSIS — N183 Chronic kidney disease, stage 3 (moderate): Secondary | ICD-10-CM | POA: Diagnosis not present

## 2016-01-06 DIAGNOSIS — N170 Acute kidney failure with tubular necrosis: Secondary | ICD-10-CM | POA: Diagnosis not present

## 2016-01-06 DIAGNOSIS — N179 Acute kidney failure, unspecified: Secondary | ICD-10-CM | POA: Diagnosis not present

## 2016-01-06 DIAGNOSIS — I2699 Other pulmonary embolism without acute cor pulmonale: Secondary | ICD-10-CM | POA: Diagnosis not present

## 2016-01-06 DIAGNOSIS — I420 Dilated cardiomyopathy: Secondary | ICD-10-CM | POA: Diagnosis not present

## 2016-01-06 DIAGNOSIS — K219 Gastro-esophageal reflux disease without esophagitis: Secondary | ICD-10-CM | POA: Diagnosis not present

## 2016-01-06 DIAGNOSIS — I129 Hypertensive chronic kidney disease with stage 1 through stage 4 chronic kidney disease, or unspecified chronic kidney disease: Secondary | ICD-10-CM | POA: Diagnosis not present

## 2016-01-06 DIAGNOSIS — R1011 Right upper quadrant pain: Secondary | ICD-10-CM | POA: Diagnosis not present

## 2016-01-06 DIAGNOSIS — Z810 Family history of intellectual disabilities: Secondary | ICD-10-CM | POA: Diagnosis not present

## 2016-01-06 DIAGNOSIS — E1122 Type 2 diabetes mellitus with diabetic chronic kidney disease: Secondary | ICD-10-CM | POA: Diagnosis not present

## 2016-01-06 DIAGNOSIS — K810 Acute cholecystitis: Secondary | ICD-10-CM | POA: Diagnosis not present

## 2016-01-06 DIAGNOSIS — I255 Ischemic cardiomyopathy: Secondary | ICD-10-CM | POA: Diagnosis not present

## 2016-01-06 DIAGNOSIS — K8000 Calculus of gallbladder with acute cholecystitis without obstruction: Secondary | ICD-10-CM | POA: Diagnosis not present

## 2016-01-06 DIAGNOSIS — Z9889 Other specified postprocedural states: Secondary | ICD-10-CM | POA: Diagnosis not present

## 2016-01-16 DIAGNOSIS — Z4502 Encounter for adjustment and management of automatic implantable cardiac defibrillator: Secondary | ICD-10-CM | POA: Diagnosis not present

## 2016-01-16 DIAGNOSIS — Z7901 Long term (current) use of anticoagulants: Secondary | ICD-10-CM | POA: Diagnosis not present

## 2016-01-16 DIAGNOSIS — I2699 Other pulmonary embolism without acute cor pulmonale: Secondary | ICD-10-CM | POA: Diagnosis not present

## 2016-01-16 DIAGNOSIS — I5022 Chronic systolic (congestive) heart failure: Secondary | ICD-10-CM | POA: Diagnosis not present

## 2016-01-18 DIAGNOSIS — C61 Malignant neoplasm of prostate: Secondary | ICD-10-CM | POA: Diagnosis not present

## 2016-01-21 DIAGNOSIS — D649 Anemia, unspecified: Secondary | ICD-10-CM | POA: Diagnosis not present

## 2016-01-21 DIAGNOSIS — E785 Hyperlipidemia, unspecified: Secondary | ICD-10-CM | POA: Diagnosis not present

## 2016-01-21 DIAGNOSIS — Z950 Presence of cardiac pacemaker: Secondary | ICD-10-CM | POA: Diagnosis not present

## 2016-01-21 DIAGNOSIS — G629 Polyneuropathy, unspecified: Secondary | ICD-10-CM | POA: Diagnosis not present

## 2016-01-21 DIAGNOSIS — I10 Essential (primary) hypertension: Secondary | ICD-10-CM | POA: Diagnosis not present

## 2016-01-21 DIAGNOSIS — E119 Type 2 diabetes mellitus without complications: Secondary | ICD-10-CM | POA: Diagnosis not present

## 2016-01-21 DIAGNOSIS — M109 Gout, unspecified: Secondary | ICD-10-CM | POA: Diagnosis not present

## 2016-01-21 DIAGNOSIS — I509 Heart failure, unspecified: Secondary | ICD-10-CM | POA: Diagnosis not present

## 2016-01-21 DIAGNOSIS — E559 Vitamin D deficiency, unspecified: Secondary | ICD-10-CM | POA: Diagnosis not present

## 2016-01-23 DIAGNOSIS — Z7901 Long term (current) use of anticoagulants: Secondary | ICD-10-CM | POA: Diagnosis not present

## 2016-01-23 DIAGNOSIS — I2699 Other pulmonary embolism without acute cor pulmonale: Secondary | ICD-10-CM | POA: Diagnosis not present

## 2016-01-30 DIAGNOSIS — Z7901 Long term (current) use of anticoagulants: Secondary | ICD-10-CM | POA: Diagnosis not present

## 2016-01-30 DIAGNOSIS — I2699 Other pulmonary embolism without acute cor pulmonale: Secondary | ICD-10-CM | POA: Diagnosis not present

## 2016-02-04 DIAGNOSIS — E559 Vitamin D deficiency, unspecified: Secondary | ICD-10-CM | POA: Diagnosis not present

## 2016-02-04 DIAGNOSIS — E669 Obesity, unspecified: Secondary | ICD-10-CM | POA: Diagnosis not present

## 2016-02-04 DIAGNOSIS — E119 Type 2 diabetes mellitus without complications: Secondary | ICD-10-CM | POA: Diagnosis not present

## 2016-02-04 DIAGNOSIS — Z23 Encounter for immunization: Secondary | ICD-10-CM | POA: Diagnosis not present

## 2016-02-04 DIAGNOSIS — I2699 Other pulmonary embolism without acute cor pulmonale: Secondary | ICD-10-CM | POA: Diagnosis not present

## 2016-02-04 DIAGNOSIS — E785 Hyperlipidemia, unspecified: Secondary | ICD-10-CM | POA: Diagnosis not present

## 2016-02-04 DIAGNOSIS — Z7689 Persons encountering health services in other specified circumstances: Secondary | ICD-10-CM | POA: Diagnosis not present

## 2016-02-06 DIAGNOSIS — I2699 Other pulmonary embolism without acute cor pulmonale: Secondary | ICD-10-CM | POA: Diagnosis not present

## 2016-02-06 DIAGNOSIS — Z7901 Long term (current) use of anticoagulants: Secondary | ICD-10-CM | POA: Diagnosis not present

## 2016-02-08 DIAGNOSIS — I2699 Other pulmonary embolism without acute cor pulmonale: Secondary | ICD-10-CM | POA: Diagnosis not present

## 2016-02-08 DIAGNOSIS — Z7901 Long term (current) use of anticoagulants: Secondary | ICD-10-CM | POA: Diagnosis not present

## 2016-02-13 DIAGNOSIS — Z7901 Long term (current) use of anticoagulants: Secondary | ICD-10-CM | POA: Diagnosis not present

## 2016-02-13 DIAGNOSIS — I2699 Other pulmonary embolism without acute cor pulmonale: Secondary | ICD-10-CM | POA: Diagnosis not present

## 2016-02-15 IMAGING — CR DG CHEST 2V
2 series · 2 of 2 positions shown · non-contrast
Comparison: Chest radiograph performed 03/08/2014

CLINICAL DATA: Acute onset of shortness of breath, wheezing and
cough. Initial encounter.

EXAM:
CHEST  2 VIEW

[w chest pa]
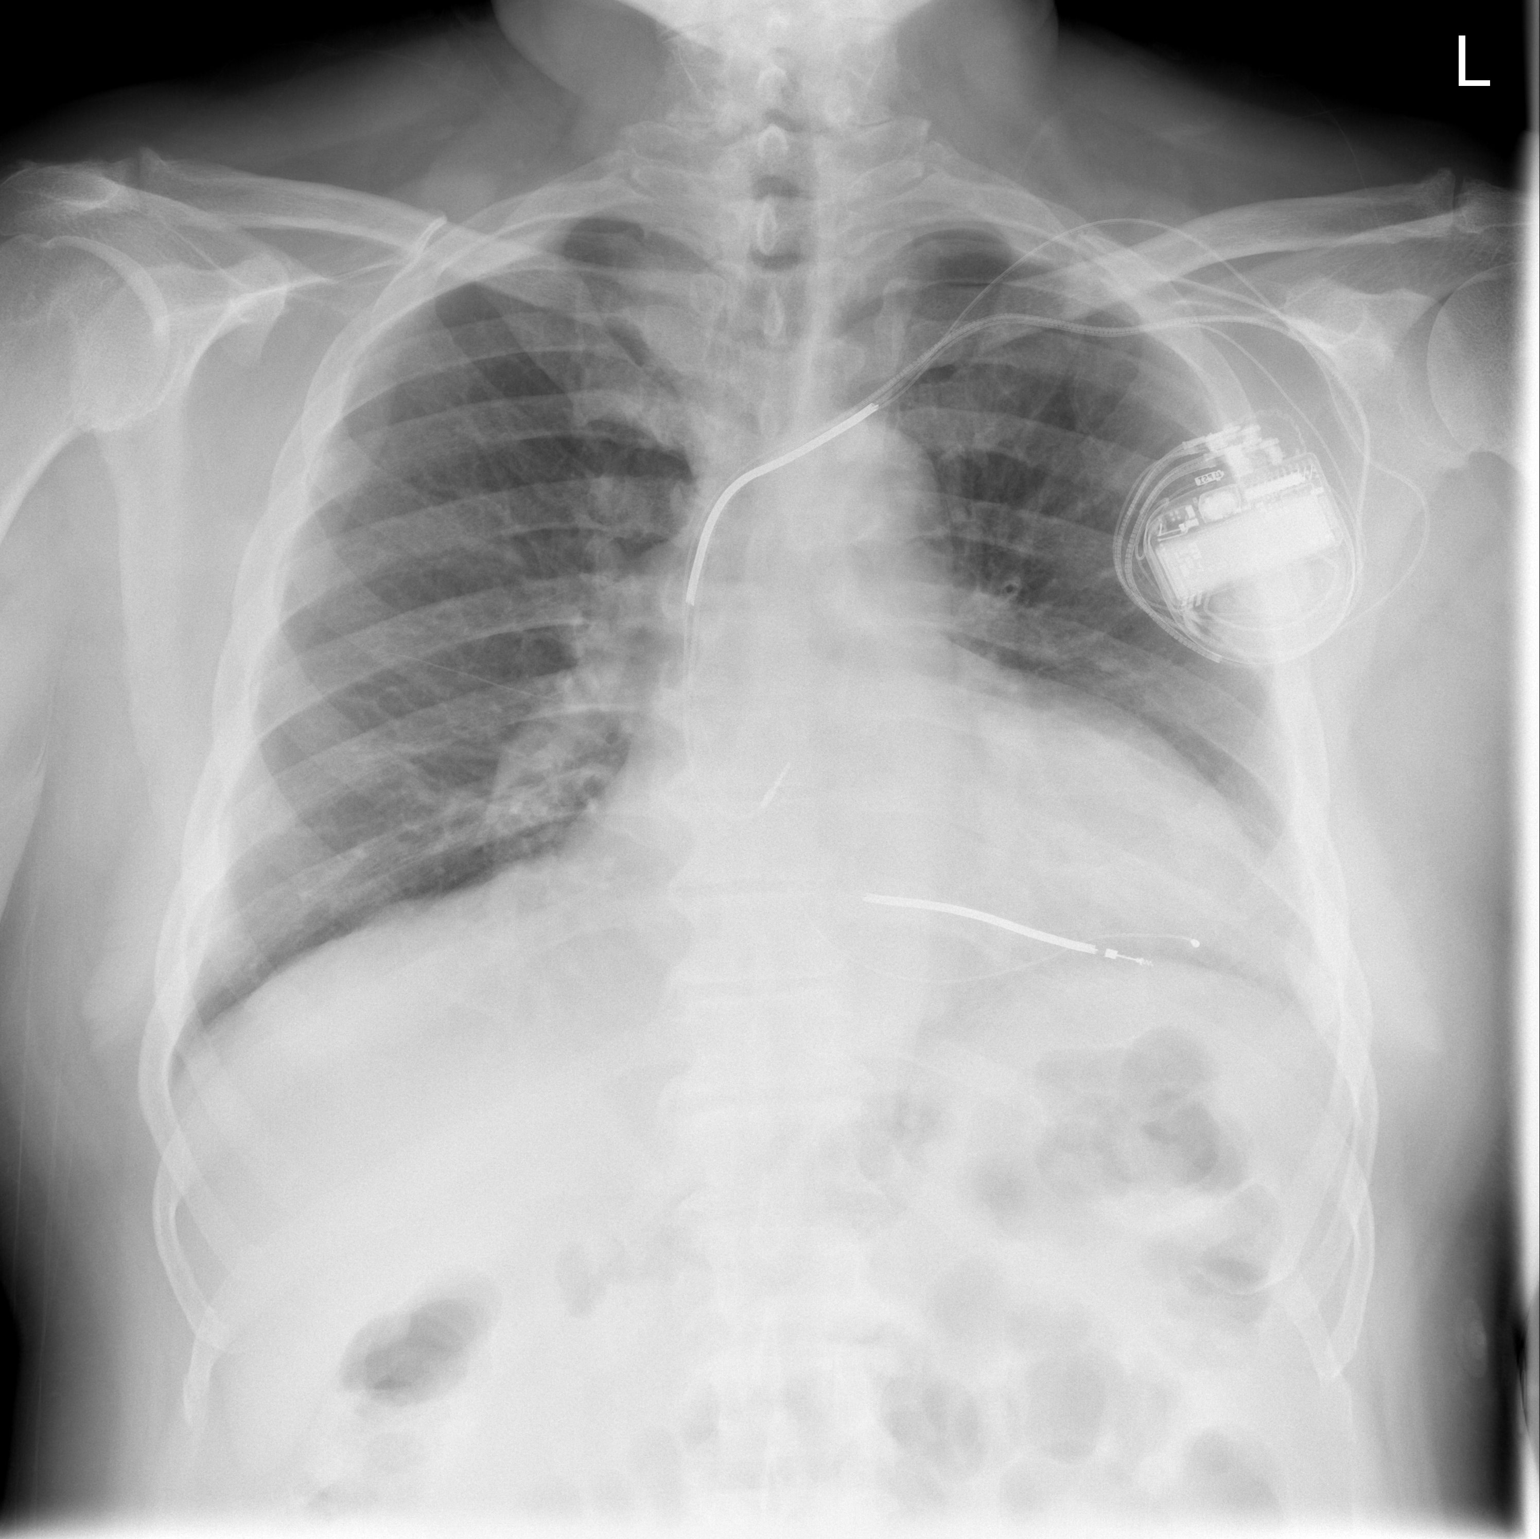

[w chest lat]
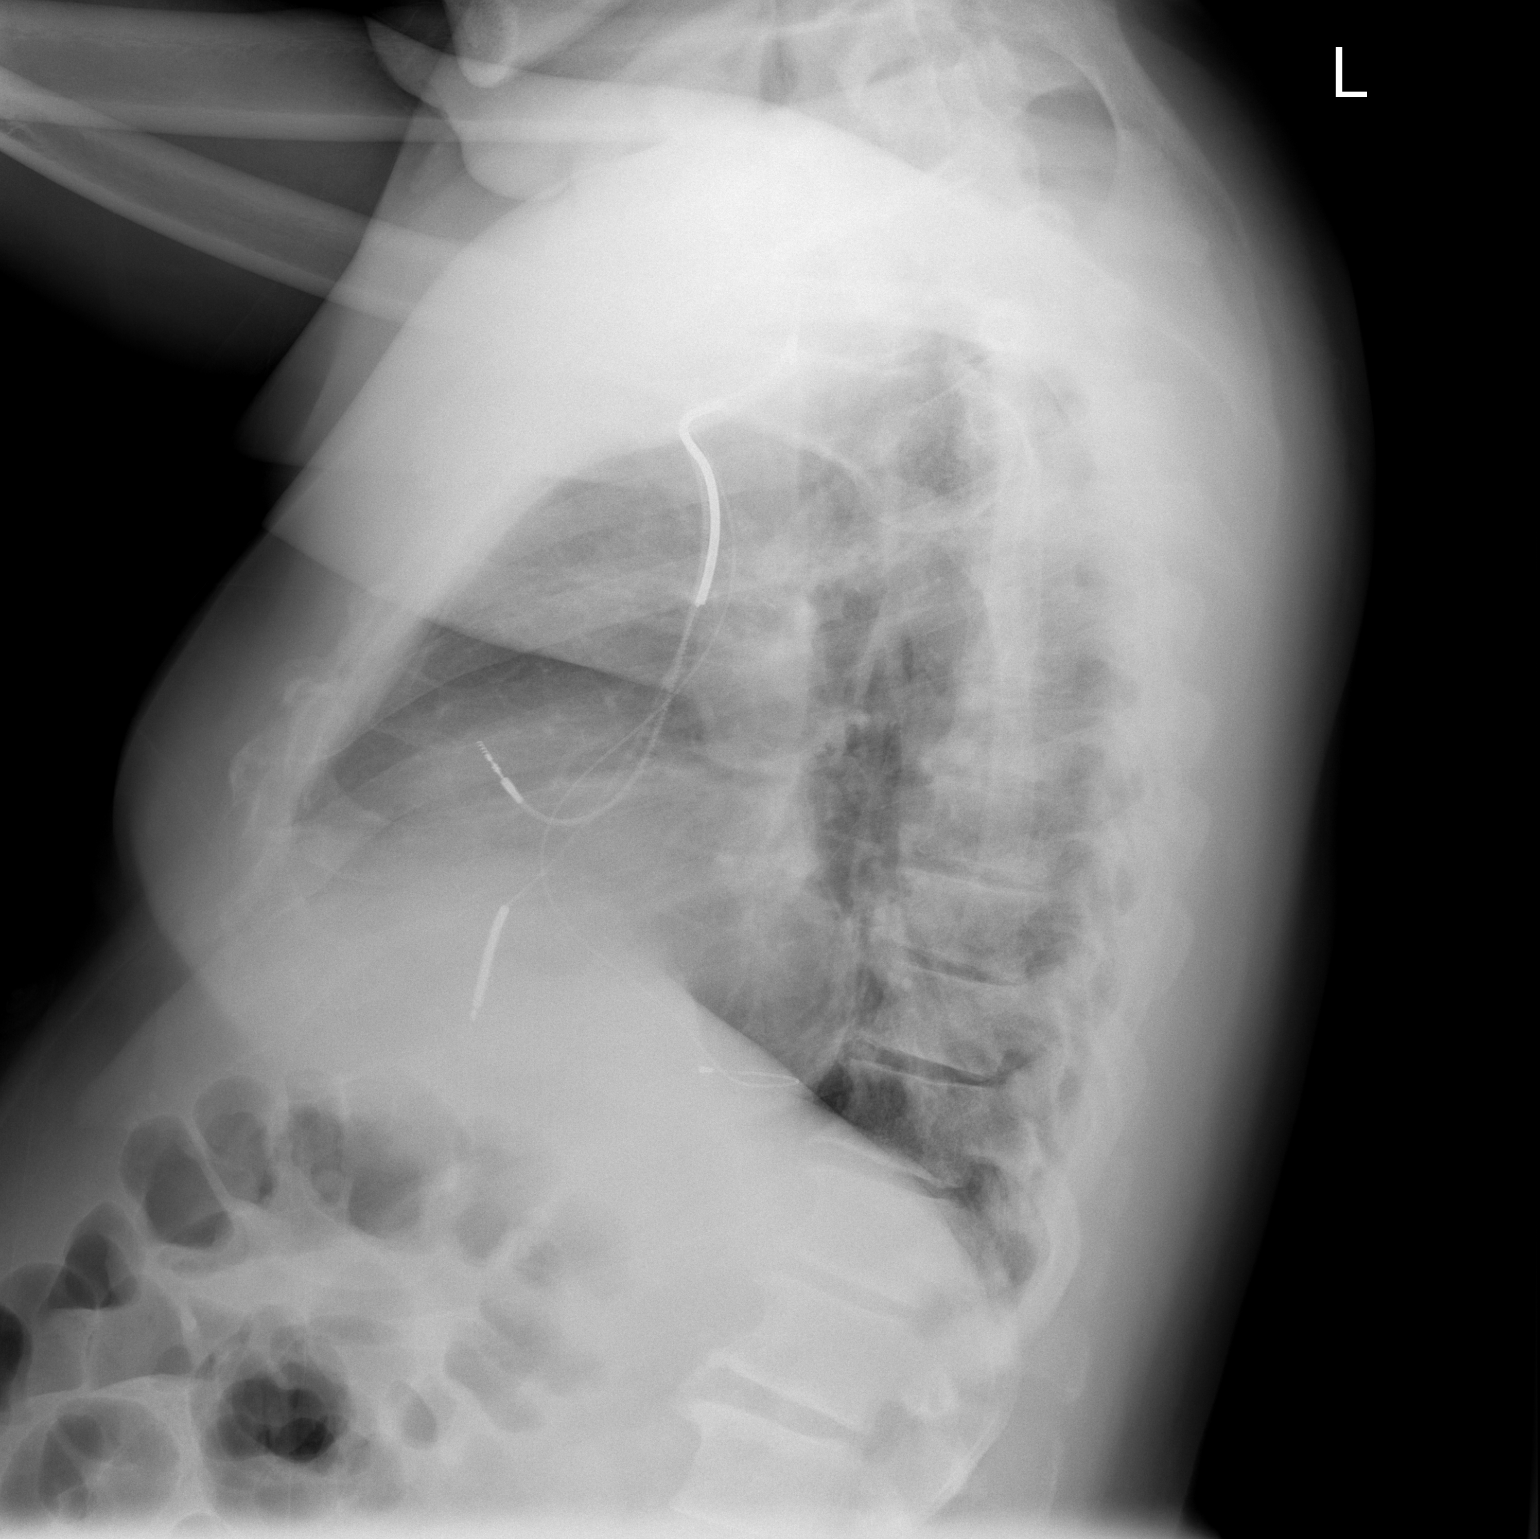

[2 of 2 positions shown; findings below may reference images not displayed]

FINDINGS: The lungs are well-aerated. Pulmonary vascularity is at the upper
limits of normal. There is no evidence of focal opacification,
pleural effusion or pneumothorax.

The heart is mildly enlarged. A pacemaker/AICD is noted at the left
chest wall, with leads ending at the right atrium, right ventricle
and coronary sinus. No acute osseous abnormalities are seen.
IMPRESSION: Mild cardiomegaly noted; lungs remain grossly clear.

## 2016-02-20 DIAGNOSIS — Z7901 Long term (current) use of anticoagulants: Secondary | ICD-10-CM | POA: Diagnosis not present

## 2016-02-20 DIAGNOSIS — I2699 Other pulmonary embolism without acute cor pulmonale: Secondary | ICD-10-CM | POA: Diagnosis not present

## 2016-02-27 DIAGNOSIS — I2699 Other pulmonary embolism without acute cor pulmonale: Secondary | ICD-10-CM | POA: Diagnosis not present

## 2016-02-27 DIAGNOSIS — Z7901 Long term (current) use of anticoagulants: Secondary | ICD-10-CM | POA: Diagnosis not present

## 2016-03-03 DIAGNOSIS — I2699 Other pulmonary embolism without acute cor pulmonale: Secondary | ICD-10-CM | POA: Diagnosis not present

## 2016-03-03 DIAGNOSIS — Z7901 Long term (current) use of anticoagulants: Secondary | ICD-10-CM | POA: Diagnosis not present

## 2016-03-05 DIAGNOSIS — E559 Vitamin D deficiency, unspecified: Secondary | ICD-10-CM | POA: Diagnosis not present

## 2016-03-05 DIAGNOSIS — E669 Obesity, unspecified: Secondary | ICD-10-CM | POA: Diagnosis not present

## 2016-03-05 DIAGNOSIS — I2699 Other pulmonary embolism without acute cor pulmonale: Secondary | ICD-10-CM | POA: Diagnosis not present

## 2016-03-05 DIAGNOSIS — E1169 Type 2 diabetes mellitus with other specified complication: Secondary | ICD-10-CM | POA: Diagnosis not present

## 2016-03-05 DIAGNOSIS — Z7901 Long term (current) use of anticoagulants: Secondary | ICD-10-CM | POA: Diagnosis not present

## 2016-03-05 DIAGNOSIS — E785 Hyperlipidemia, unspecified: Secondary | ICD-10-CM | POA: Diagnosis not present

## 2016-03-06 DIAGNOSIS — C61 Malignant neoplasm of prostate: Secondary | ICD-10-CM | POA: Diagnosis not present

## 2016-03-19 DIAGNOSIS — I480 Paroxysmal atrial fibrillation: Secondary | ICD-10-CM | POA: Diagnosis not present

## 2016-03-19 DIAGNOSIS — I428 Other cardiomyopathies: Secondary | ICD-10-CM | POA: Diagnosis not present

## 2016-03-19 DIAGNOSIS — N183 Chronic kidney disease, stage 3 (moderate): Secondary | ICD-10-CM | POA: Diagnosis not present

## 2016-03-19 DIAGNOSIS — Z95810 Presence of automatic (implantable) cardiac defibrillator: Secondary | ICD-10-CM | POA: Diagnosis not present

## 2016-03-19 DIAGNOSIS — I10 Essential (primary) hypertension: Secondary | ICD-10-CM | POA: Diagnosis not present

## 2016-03-19 DIAGNOSIS — Z7901 Long term (current) use of anticoagulants: Secondary | ICD-10-CM | POA: Diagnosis not present

## 2016-03-19 DIAGNOSIS — I2699 Other pulmonary embolism without acute cor pulmonale: Secondary | ICD-10-CM | POA: Diagnosis not present

## 2016-03-21 DIAGNOSIS — C61 Malignant neoplasm of prostate: Secondary | ICD-10-CM | POA: Diagnosis not present

## 2016-04-01 DIAGNOSIS — I2699 Other pulmonary embolism without acute cor pulmonale: Secondary | ICD-10-CM | POA: Diagnosis not present

## 2016-04-01 DIAGNOSIS — Z7901 Long term (current) use of anticoagulants: Secondary | ICD-10-CM | POA: Diagnosis not present

## 2016-04-02 DIAGNOSIS — Z0000 Encounter for general adult medical examination without abnormal findings: Secondary | ICD-10-CM | POA: Diagnosis not present

## 2016-04-02 DIAGNOSIS — I10 Essential (primary) hypertension: Secondary | ICD-10-CM | POA: Diagnosis not present

## 2016-04-02 DIAGNOSIS — M109 Gout, unspecified: Secondary | ICD-10-CM | POA: Diagnosis not present

## 2016-04-02 DIAGNOSIS — E1165 Type 2 diabetes mellitus with hyperglycemia: Secondary | ICD-10-CM | POA: Diagnosis not present

## 2016-04-02 DIAGNOSIS — N183 Chronic kidney disease, stage 3 (moderate): Secondary | ICD-10-CM | POA: Diagnosis not present

## 2016-04-02 DIAGNOSIS — E785 Hyperlipidemia, unspecified: Secondary | ICD-10-CM | POA: Diagnosis not present

## 2016-04-02 DIAGNOSIS — Z23 Encounter for immunization: Secondary | ICD-10-CM | POA: Diagnosis not present

## 2016-04-04 DIAGNOSIS — Z95810 Presence of automatic (implantable) cardiac defibrillator: Secondary | ICD-10-CM | POA: Diagnosis not present

## 2016-04-09 DIAGNOSIS — I2699 Other pulmonary embolism without acute cor pulmonale: Secondary | ICD-10-CM | POA: Diagnosis not present

## 2016-04-09 DIAGNOSIS — Z7901 Long term (current) use of anticoagulants: Secondary | ICD-10-CM | POA: Diagnosis not present

## 2016-04-12 DIAGNOSIS — I2699 Other pulmonary embolism without acute cor pulmonale: Secondary | ICD-10-CM | POA: Diagnosis not present

## 2016-04-12 DIAGNOSIS — Z7901 Long term (current) use of anticoagulants: Secondary | ICD-10-CM | POA: Diagnosis not present

## 2016-04-16 DIAGNOSIS — Z7901 Long term (current) use of anticoagulants: Secondary | ICD-10-CM | POA: Diagnosis not present

## 2016-04-16 DIAGNOSIS — I2699 Other pulmonary embolism without acute cor pulmonale: Secondary | ICD-10-CM | POA: Diagnosis not present

## 2016-04-30 DIAGNOSIS — I2699 Other pulmonary embolism without acute cor pulmonale: Secondary | ICD-10-CM | POA: Diagnosis not present

## 2016-04-30 DIAGNOSIS — Z7901 Long term (current) use of anticoagulants: Secondary | ICD-10-CM | POA: Diagnosis not present

## 2016-05-14 DIAGNOSIS — Z7901 Long term (current) use of anticoagulants: Secondary | ICD-10-CM | POA: Diagnosis not present

## 2016-05-14 DIAGNOSIS — I2699 Other pulmonary embolism without acute cor pulmonale: Secondary | ICD-10-CM | POA: Diagnosis not present

## 2016-05-17 DIAGNOSIS — I2699 Other pulmonary embolism without acute cor pulmonale: Secondary | ICD-10-CM | POA: Diagnosis not present

## 2016-05-17 DIAGNOSIS — Z7901 Long term (current) use of anticoagulants: Secondary | ICD-10-CM | POA: Diagnosis not present

## 2016-05-21 DIAGNOSIS — I2699 Other pulmonary embolism without acute cor pulmonale: Secondary | ICD-10-CM | POA: Diagnosis not present

## 2016-05-21 DIAGNOSIS — Z7901 Long term (current) use of anticoagulants: Secondary | ICD-10-CM | POA: Diagnosis not present

## 2016-05-28 DIAGNOSIS — I2699 Other pulmonary embolism without acute cor pulmonale: Secondary | ICD-10-CM | POA: Diagnosis not present

## 2016-05-28 DIAGNOSIS — Z7901 Long term (current) use of anticoagulants: Secondary | ICD-10-CM | POA: Diagnosis not present

## 2016-06-18 DIAGNOSIS — I2699 Other pulmonary embolism without acute cor pulmonale: Secondary | ICD-10-CM | POA: Diagnosis not present

## 2016-06-18 DIAGNOSIS — Z7901 Long term (current) use of anticoagulants: Secondary | ICD-10-CM | POA: Diagnosis not present

## 2016-06-22 DIAGNOSIS — I2699 Other pulmonary embolism without acute cor pulmonale: Secondary | ICD-10-CM | POA: Diagnosis not present

## 2016-06-22 DIAGNOSIS — Z7901 Long term (current) use of anticoagulants: Secondary | ICD-10-CM | POA: Diagnosis not present

## 2016-06-25 DIAGNOSIS — I2699 Other pulmonary embolism without acute cor pulmonale: Secondary | ICD-10-CM | POA: Diagnosis not present

## 2016-06-25 DIAGNOSIS — Z7901 Long term (current) use of anticoagulants: Secondary | ICD-10-CM | POA: Diagnosis not present

## 2016-07-02 DIAGNOSIS — I2699 Other pulmonary embolism without acute cor pulmonale: Secondary | ICD-10-CM | POA: Diagnosis not present

## 2016-07-02 DIAGNOSIS — Z7901 Long term (current) use of anticoagulants: Secondary | ICD-10-CM | POA: Diagnosis not present

## 2016-07-03 DIAGNOSIS — Z760 Encounter for issue of repeat prescription: Secondary | ICD-10-CM | POA: Diagnosis not present

## 2016-07-03 DIAGNOSIS — I509 Heart failure, unspecified: Secondary | ICD-10-CM | POA: Diagnosis not present

## 2016-07-03 DIAGNOSIS — E785 Hyperlipidemia, unspecified: Secondary | ICD-10-CM | POA: Diagnosis not present

## 2016-07-03 DIAGNOSIS — E559 Vitamin D deficiency, unspecified: Secondary | ICD-10-CM | POA: Diagnosis not present

## 2016-07-03 DIAGNOSIS — D649 Anemia, unspecified: Secondary | ICD-10-CM | POA: Diagnosis not present

## 2016-07-03 DIAGNOSIS — N183 Chronic kidney disease, stage 3 (moderate): Secondary | ICD-10-CM | POA: Diagnosis not present

## 2016-07-03 DIAGNOSIS — Z09 Encounter for follow-up examination after completed treatment for conditions other than malignant neoplasm: Secondary | ICD-10-CM | POA: Diagnosis not present

## 2016-07-03 DIAGNOSIS — E1165 Type 2 diabetes mellitus with hyperglycemia: Secondary | ICD-10-CM | POA: Diagnosis not present

## 2016-07-07 DIAGNOSIS — Z95810 Presence of automatic (implantable) cardiac defibrillator: Secondary | ICD-10-CM | POA: Diagnosis not present

## 2016-07-09 DIAGNOSIS — Z7901 Long term (current) use of anticoagulants: Secondary | ICD-10-CM | POA: Diagnosis not present

## 2016-07-09 DIAGNOSIS — I2699 Other pulmonary embolism without acute cor pulmonale: Secondary | ICD-10-CM | POA: Diagnosis not present

## 2016-07-18 DIAGNOSIS — C61 Malignant neoplasm of prostate: Secondary | ICD-10-CM | POA: Diagnosis not present

## 2016-07-18 DIAGNOSIS — R339 Retention of urine, unspecified: Secondary | ICD-10-CM | POA: Diagnosis not present

## 2016-07-21 DIAGNOSIS — H524 Presbyopia: Secondary | ICD-10-CM | POA: Diagnosis not present

## 2016-07-30 DIAGNOSIS — Z7901 Long term (current) use of anticoagulants: Secondary | ICD-10-CM | POA: Diagnosis not present

## 2016-07-30 DIAGNOSIS — I2699 Other pulmonary embolism without acute cor pulmonale: Secondary | ICD-10-CM | POA: Diagnosis not present

## 2016-08-06 DIAGNOSIS — Z7901 Long term (current) use of anticoagulants: Secondary | ICD-10-CM | POA: Diagnosis not present

## 2016-08-06 DIAGNOSIS — I2699 Other pulmonary embolism without acute cor pulmonale: Secondary | ICD-10-CM | POA: Diagnosis not present

## 2016-08-13 ENCOUNTER — Emergency Department (HOSPITAL_BASED_OUTPATIENT_CLINIC_OR_DEPARTMENT_OTHER): Payer: Medicare HMO

## 2016-08-13 ENCOUNTER — Emergency Department (HOSPITAL_BASED_OUTPATIENT_CLINIC_OR_DEPARTMENT_OTHER)
Admission: EM | Admit: 2016-08-13 | Discharge: 2016-08-14 | Disposition: A | Discharge status: Home / Self Care | Payer: Medicare HMO | Attending: Emergency Medicine | Admitting: Emergency Medicine

## 2016-08-13 ENCOUNTER — Encounter (HOSPITAL_BASED_OUTPATIENT_CLINIC_OR_DEPARTMENT_OTHER): Payer: Self-pay | Admitting: Emergency Medicine

## 2016-08-13 DIAGNOSIS — Z8546 Personal history of malignant neoplasm of prostate: Secondary | ICD-10-CM | POA: Insufficient documentation

## 2016-08-13 DIAGNOSIS — Z79899 Other long term (current) drug therapy: Secondary | ICD-10-CM | POA: Insufficient documentation

## 2016-08-13 DIAGNOSIS — I10 Essential (primary) hypertension: Secondary | ICD-10-CM | POA: Insufficient documentation

## 2016-08-13 DIAGNOSIS — Z7901 Long term (current) use of anticoagulants: Secondary | ICD-10-CM | POA: Insufficient documentation

## 2016-08-13 DIAGNOSIS — I2699 Other pulmonary embolism without acute cor pulmonale: Secondary | ICD-10-CM | POA: Diagnosis not present

## 2016-08-13 DIAGNOSIS — J069 Acute upper respiratory infection, unspecified: Secondary | ICD-10-CM | POA: Insufficient documentation

## 2016-08-13 DIAGNOSIS — R05 Cough: Secondary | ICD-10-CM | POA: Diagnosis not present

## 2016-08-13 LAB — COMPREHENSIVE METABOLIC PANEL
ALT: 14 U/L — ABNORMAL LOW (ref 17–63)
AST: 19 U/L (ref 15–41)
Albumin: 3.5 g/dL (ref 3.5–5.0)
Alkaline Phosphatase: 53 U/L (ref 38–126)
Anion gap: 7 (ref 5–15)
BUN: 50 mg/dL — ABNORMAL HIGH (ref 6–20)
CO2: 29 mmol/L (ref 22–32)
Calcium: 9.2 mg/dL (ref 8.9–10.3)
Chloride: 100 mmol/L — ABNORMAL LOW (ref 101–111)
Creatinine, Ser: 2.2 mg/dL — ABNORMAL HIGH (ref 0.61–1.24)
GFR calc Af Amer: 31 mL/min — ABNORMAL LOW (ref >60)
GFR calc non Af Amer: 27 mL/min — ABNORMAL LOW (ref >60)
Glucose, Bld: 152 mg/dL — ABNORMAL HIGH (ref 65–99)
Potassium: 2.9 mmol/L — ABNORMAL LOW (ref 3.5–5.1)
Sodium: 136 mmol/L (ref 135–145)
Total Bilirubin: 0.4 mg/dL (ref 0.3–1.2)
Total Protein: 6.5 g/dL (ref 6.5–8.1)

## 2016-08-13 LAB — URINALYSIS, ROUTINE W REFLEX MICROSCOPIC
Bilirubin Urine: NEGATIVE
Glucose, UA: NEGATIVE mg/dL
Hgb urine dipstick: NEGATIVE
Ketones, ur: NEGATIVE mg/dL
Leukocytes, UA: NEGATIVE
Nitrite: NEGATIVE
Protein, ur: NEGATIVE mg/dL
Specific Gravity, Urine: 1.005 (ref 1.005–1.030)
pH: 5.5 (ref 5.0–8.0)

## 2016-08-13 MED ORDER — ALBUTEROL SULFATE (2.5 MG/3ML) 0.083% IN NEBU
5.0000 mg | INHALATION_SOLUTION | Freq: Once | RESPIRATORY_TRACT | Status: AC
  Administered 2016-08-13: 5 mg via RESPIRATORY_TRACT
  Filled 2016-08-13: qty 6

## 2016-08-13 MED ORDER — IPRATROPIUM BROMIDE 0.02 % IN SOLN
0.5000 mg | RESPIRATORY_TRACT | Status: AC
  Administered 2016-08-13: 0.5 mg via RESPIRATORY_TRACT
  Filled 2016-08-13: qty 2.5

## 2016-08-13 NOTE — ED Triage Notes (Signed)
Pt states loose bowels and cold symptoms since yesterday.

## 2016-08-13 NOTE — ED Provider Notes (Signed)
[facility] Provider Note   CSN: [id number] Arrival date & time: 08/13/16  2136  By signing my name below, I, Julien Nordmann, attest that this documentation has been prepared under the direction and in the presence of [employer], PA-C.  Electronically Signed: Julien Nordmann, ED Scribe. 08/13/16. 11:01 PM.    History   Chief Complaint Chief Complaint  Patient presents with  . Chills   The history is provided by the patient. No language interpreter was used.   HPI Comments: Ronald Nicholson is a 81 y.o. male who has a PMhx of DM, HTN, CHF, and prostate cancer presents to the Emergency Department complaining of moderate, gradual worsening cough x 4 days. He reports associated wheezing, nausea and diarrhea. He reports taking OTC cold medication to alleviate his pain without relief. Pt is currently on coumadin. He denies generalized myalgias, nausea, vomiting, sore throat, shortness of breath.  Past Medical History:  Diagnosis Date  . Gout   . Heart attack   . Hypertension   . Irregular heart rate   . Kidney stones   . Prostate cancer [facility])     Patient Active Problem List   Diagnosis Date Noted  . Left shoulder pain 07/04/2013    Past Surgical History:  Procedure Laterality Date  . CARDIAC DEFIBRILLATOR PLACEMENT    . CARDIAC DEFIBRILLATOR PLACEMENT    . PACEMAKER INSERTION    . SHOULDER SURGERY         Home Medications    Prior to Admission medications   Medication Sig Start Date End Date Taking? Authorizing Provider  albuterol (PROVENTIL HFA;VENTOLIN HFA) 108 (90 BASE) MCG/ACT inhaler Inhale 1-2 puffs into the lungs every 6 (six) hours as needed for wheezing or shortness of breath. 03/08/14   Debby Freiberg, MD  albuterol (PROVENTIL HFA;VENTOLIN HFA) 108 (90 BASE) MCG/ACT inhaler Inhale 1-2 puffs into the lungs every 6 (six) hours as needed for wheezing or shortness of breath. 05/26/14   Charlesetta Shanks, MD  albuterol (PROVENTIL) (2.5 MG/3ML) 0.083%  nebulizer solution Take 3 mLs (2.5 mg total) by nebulization every 4 (four) hours as needed for wheezing or shortness of breath. 05/26/14   Charlesetta Shanks, MD  allopurinol (ZYLOPRIM) 300 MG tablet Take 300 mg by mouth daily.    Historical Provider, MD  bisacodyl (DULCOLAX) 5 MG EC tablet Take 5 mg by mouth daily as needed. For constipation    Historical Provider, MD  cephALEXin (KEFLEX) 250 MG capsule Take 1 capsule (250 mg total) by mouth 4 (four) times daily. 06/30/15   Alfonzo Beers, MD  colchicine 0.6 MG tablet Take 0.6 mg by mouth 2 (two) times daily.    Historical Provider, MD  doxazosin (CARDURA) 4 MG tablet Take 4 mg by mouth at bedtime.    Historical Provider, MD  ferrous sulfate 325 (65 FE) MG tablet Take 325 mg by mouth daily with breakfast.    Historical Provider, MD  fosinopril (MONOPRIL) 20 MG tablet Take 20 mg by mouth daily.    Historical Provider, MD  gabapentin (NEURONTIN) 300 MG capsule Take 300 mg by mouth 3 (three) times daily.    Historical Provider, MD  glycerin adult (GLYCERIN ADULT) 2 G SUPP Place 1 suppository rectally daily as needed. For constipation    Historical Provider, MD  hydrochlorothiazide (HYDRODIURIL) 25 MG tablet Take 25 mg by mouth daily.    Historical Provider, MD  ipratropium (ATROVENT) 0.02 % nebulizer solution Take 2.5 mLs (0.5 mg total) by nebulization 4 (four) times daily.  05/26/14   Charlesetta Shanks, MD  OVER THE COUNTER MEDICATION Take 2-4 capsules by mouth daily with breakfast. "LBS II" (lower bowel stimulant"    Historical Provider, MD  polyethylene glycol (GOLYTELY) 236 G solution Take 4,000 mLs by mouth once. 03/13/12   Riki Altes, MD  potassium chloride (K-DUR) 10 MEQ tablet Take 10 mEq by mouth daily.    Historical Provider, MD  predniSONE (DELTASONE) 50 MG tablet Take 1 tablet (50 mg total) by mouth daily with breakfast. 03/08/14   Debby Freiberg, MD  propranolol (INDERAL) 80 MG tablet  05/19/13   Historical Provider, MD  torsemide (DEMADEX) 20 MG  tablet Take 20 mg by mouth daily.    Historical Provider, MD  traMADol (ULTRAM) 50 MG tablet Take 1 tablet (50 mg total) by mouth every 6 (six) hours as needed. 06/26/13   Fredia Sorrow, MD  warfarin (COUMADIN) 5 MG tablet Take 7.5 mg by mouth daily.    Historical Provider, MD    Family History Family History  Problem Relation Age of Onset  . Hypertension Mother   . Hypertension Father   . Hypertension Brother     Social History Social History  Substance Use Topics  . Smoking status: Never Smoker  . Smokeless tobacco: Never Used  . Alcohol use No     Allergies   Ibuprofen   Review of Systems Review of Systems  A complete 10 system review of systems was obtained and all systems are negative except as noted in the HPI and PMH.   Physical Exam Updated Vital Signs BP (!) 142/68   Pulse 79   Temp 97.9 F (36.6 C) (Oral)   Resp 18   Ht 5\' 8"  (1.727 m)   Wt 227 lb (103 kg)   SpO2 99%   BMI 34.52 kg/m   Physical Exam  Constitutional: He is oriented to person, place, and time. He appears well-developed and well-nourished.  HENT:  Head: Normocephalic and atraumatic.  Nose: Nose normal.  Mouth/Throat: Oropharynx is clear and moist.  Eyes: EOM are normal.  Neck: Normal range of motion.  Cardiovascular: Normal rate, regular rhythm, normal heart sounds and intact distal pulses.   Pulmonary/Chest: Effort normal. No respiratory distress. He has wheezes. He has no rales. He exhibits no tenderness.  Bilateral expiratory wheezing  Abdominal: Soft. He exhibits no distension. There is no tenderness.  Musculoskeletal: Normal range of motion.  Neurological: He is alert and oriented to person, place, and time. No sensory deficit. He exhibits normal muscle tone. Coordination normal.  Skin: Skin is warm and dry.  Psychiatric: He has a normal mood and affect. Judgment normal.  Nursing note and vitals reviewed.    ED Treatments / Results  DIAGNOSTIC STUDIES: Oxygen Saturation  is 99% on RA, normal by my interpretation.  COORDINATION OF CARE:  11:00 PM Discussed treatment plan with pt at bedside and pt agreed to plan.  Labs (all labs ordered are listed, but only abnormal results are displayed) Labs Reviewed  COMPREHENSIVE METABOLIC PANEL  CBC WITH DIFFERENTIAL/PLATELET  URINALYSIS, ROUTINE W REFLEX MICROSCOPIC    EKG  EKG Interpretation None       Radiology Dg Chest 2 View  Result Date: 08/13/2016 CLINICAL DATA:  81 y/o  M; cough. EXAM: CHEST  2 VIEW COMPARISON:  01/06/2016 chest radiograph FINDINGS: Stable mild cardiomegaly. Three lead AICD. Right upper quadrant surgical clips, presumably cholecystectomy. Low lung volumes accentuate pulmonary markings. Streaky opacities at lung bases probably represents minor atelectasis. No consolidation, effusion,  or pneumothorax identified. Multilevel degenerative changes of the spine. IMPRESSION: Stable mild cardiomegaly. Low lung volumes. Minor bibasilar atelectasis. Electronically Signed   By: Kristine Garbe M.D.   On: 08/13/2016 22:36    Procedures Procedures (including critical care time)  Medications Ordered in ED Medications - No data to display   Initial Impression / Assessment and Plan / ED Course  I have reviewed the triage vital signs and the nursing notes.  Pertinent labs & imaging results that were available during my care of the patient were reviewed by me and considered in my medical decision making (see chart for details).     Patient be treated for a pneumonia-like illness, along with his bronchitis.  She is advised of the plan and all questions were answered.  He has tolerated the treatments well in the emergency Department and his pulse oximetry stays within normal limits.  She is advised follow-up with his primary care Dr. told to return here as needed  Final Clinical Impressions(s) / ED Diagnoses   Final diagnoses:  None   I personally performed the services described in this  documentation, which was scribed in my presence. The recorded information has been reviewed and is accurate. New Prescriptions New Prescriptions   No medications on file     Dalia Heading, PA-C 08/15/16 9470    Julianne Rice, MD 08/16/16 2239

## 2016-08-14 LAB — CBC WITH DIFFERENTIAL/PLATELET
Basophils Absolute: 0 10*3/uL (ref 0.0–0.1)
Basophils Relative: 0 %
Eosinophils Absolute: 0.4 10*3/uL (ref 0.0–0.7)
Eosinophils Relative: 9 %
HCT: 32.9 % — ABNORMAL LOW (ref 39.0–52.0)
Hemoglobin: 10.7 g/dL — ABNORMAL LOW (ref 13.0–17.0)
Lymphocytes Relative: 27 %
Lymphs Abs: 1.3 10*3/uL (ref 0.7–4.0)
MCH: 24.9 pg — ABNORMAL LOW (ref 26.0–34.0)
MCHC: 32.5 g/dL (ref 30.0–36.0)
MCV: 76.7 fL — ABNORMAL LOW (ref 78.0–100.0)
Monocytes Absolute: 0.6 10*3/uL (ref 0.1–1.0)
Monocytes Relative: 12 %
Neutro Abs: 2.6 10*3/uL (ref 1.7–7.7)
Neutrophils Relative %: 53 %
Platelets: 75 10*3/uL — ABNORMAL LOW (ref 150–400)
RBC: 4.29 MIL/uL (ref 4.22–5.81)
RDW: 15.7 % — ABNORMAL HIGH (ref 11.5–15.5)
WBC: 4.9 10*3/uL (ref 4.0–10.5)

## 2016-08-14 MED ORDER — PROMETHAZINE-DM 6.25-15 MG/5ML PO SYRP: 5.0000 mL | ORAL_SOLUTION | Freq: Four times a day (QID) | ORAL | 0 refills | Status: AC | PRN

## 2016-08-14 MED ORDER — GUAIFENESIN ER 1200 MG PO TB12: 1.0000 | ORAL_TABLET | Freq: Two times a day (BID) | ORAL | 0 refills | Status: AC

## 2016-08-14 MED ORDER — POTASSIUM CHLORIDE CRYS ER 20 MEQ PO TBCR
40.0000 meq | EXTENDED_RELEASE_TABLET | Freq: Once | ORAL | Status: AC
  Administered 2016-08-14: 40 meq via ORAL
  Filled 2016-08-14: qty 2

## 2016-08-14 MED ORDER — DOXYCYCLINE HYCLATE 100 MG PO CAPS: 100.0000 mg | ORAL_CAPSULE | Freq: Two times a day (BID) | ORAL | 0 refills | Status: AC

## 2016-08-14 NOTE — Discharge Instructions (Signed)
Return here as needed.  Follow-up with your primary care doctor °

## 2016-08-20 DIAGNOSIS — N183 Chronic kidney disease, stage 3 (moderate): Secondary | ICD-10-CM | POA: Diagnosis not present

## 2016-08-20 DIAGNOSIS — E1122 Type 2 diabetes mellitus with diabetic chronic kidney disease: Secondary | ICD-10-CM | POA: Diagnosis not present

## 2016-08-20 DIAGNOSIS — I429 Cardiomyopathy, unspecified: Secondary | ICD-10-CM | POA: Diagnosis not present

## 2016-08-20 DIAGNOSIS — I129 Hypertensive chronic kidney disease with stage 1 through stage 4 chronic kidney disease, or unspecified chronic kidney disease: Secondary | ICD-10-CM | POA: Diagnosis not present

## 2016-08-20 DIAGNOSIS — D509 Iron deficiency anemia, unspecified: Secondary | ICD-10-CM | POA: Diagnosis not present

## 2016-08-20 DIAGNOSIS — D631 Anemia in chronic kidney disease: Secondary | ICD-10-CM | POA: Diagnosis not present

## 2016-08-20 DIAGNOSIS — E876 Hypokalemia: Secondary | ICD-10-CM | POA: Diagnosis not present

## 2016-08-27 DIAGNOSIS — I2699 Other pulmonary embolism without acute cor pulmonale: Secondary | ICD-10-CM | POA: Diagnosis not present

## 2016-08-27 DIAGNOSIS — Z7901 Long term (current) use of anticoagulants: Secondary | ICD-10-CM | POA: Diagnosis not present

## 2016-09-01 DIAGNOSIS — I480 Paroxysmal atrial fibrillation: Secondary | ICD-10-CM | POA: Diagnosis not present

## 2016-09-01 DIAGNOSIS — I10 Essential (primary) hypertension: Secondary | ICD-10-CM | POA: Diagnosis not present

## 2016-09-01 DIAGNOSIS — N183 Chronic kidney disease, stage 3 (moderate): Secondary | ICD-10-CM | POA: Diagnosis not present

## 2016-09-01 DIAGNOSIS — Z95810 Presence of automatic (implantable) cardiac defibrillator: Secondary | ICD-10-CM | POA: Diagnosis not present

## 2016-09-01 DIAGNOSIS — N179 Acute kidney failure, unspecified: Secondary | ICD-10-CM | POA: Diagnosis not present

## 2016-09-01 DIAGNOSIS — I509 Heart failure, unspecified: Secondary | ICD-10-CM | POA: Diagnosis not present

## 2016-09-01 DIAGNOSIS — I428 Other cardiomyopathies: Secondary | ICD-10-CM | POA: Diagnosis not present

## 2016-09-03 DIAGNOSIS — Z7901 Long term (current) use of anticoagulants: Secondary | ICD-10-CM | POA: Diagnosis not present

## 2016-09-03 DIAGNOSIS — I2699 Other pulmonary embolism without acute cor pulmonale: Secondary | ICD-10-CM | POA: Diagnosis not present

## 2016-09-17 DIAGNOSIS — I2699 Other pulmonary embolism without acute cor pulmonale: Secondary | ICD-10-CM | POA: Diagnosis not present

## 2016-09-17 DIAGNOSIS — Z7901 Long term (current) use of anticoagulants: Secondary | ICD-10-CM | POA: Diagnosis not present

## 2016-09-24 DIAGNOSIS — Z7901 Long term (current) use of anticoagulants: Secondary | ICD-10-CM | POA: Diagnosis not present

## 2016-09-24 DIAGNOSIS — I2699 Other pulmonary embolism without acute cor pulmonale: Secondary | ICD-10-CM | POA: Diagnosis not present

## 2016-09-30 DIAGNOSIS — E119 Type 2 diabetes mellitus without complications: Secondary | ICD-10-CM | POA: Diagnosis not present

## 2016-09-30 DIAGNOSIS — D509 Iron deficiency anemia, unspecified: Secondary | ICD-10-CM | POA: Diagnosis not present

## 2016-09-30 DIAGNOSIS — Z0000 Encounter for general adult medical examination without abnormal findings: Secondary | ICD-10-CM | POA: Diagnosis not present

## 2016-09-30 DIAGNOSIS — E785 Hyperlipidemia, unspecified: Secondary | ICD-10-CM | POA: Diagnosis not present

## 2016-10-01 DIAGNOSIS — Z7901 Long term (current) use of anticoagulants: Secondary | ICD-10-CM | POA: Diagnosis not present

## 2016-10-01 DIAGNOSIS — I2699 Other pulmonary embolism without acute cor pulmonale: Secondary | ICD-10-CM | POA: Diagnosis not present

## 2016-10-07 DIAGNOSIS — I509 Heart failure, unspecified: Secondary | ICD-10-CM | POA: Diagnosis not present

## 2016-10-07 DIAGNOSIS — Z95810 Presence of automatic (implantable) cardiac defibrillator: Secondary | ICD-10-CM | POA: Diagnosis not present

## 2016-10-08 DIAGNOSIS — Z7901 Long term (current) use of anticoagulants: Secondary | ICD-10-CM | POA: Diagnosis not present

## 2016-10-08 DIAGNOSIS — I2699 Other pulmonary embolism without acute cor pulmonale: Secondary | ICD-10-CM | POA: Diagnosis not present

## 2016-10-09 DIAGNOSIS — J4521 Mild intermittent asthma with (acute) exacerbation: Secondary | ICD-10-CM | POA: Diagnosis not present

## 2016-10-09 DIAGNOSIS — E785 Hyperlipidemia, unspecified: Secondary | ICD-10-CM | POA: Diagnosis not present

## 2016-10-09 DIAGNOSIS — N183 Chronic kidney disease, stage 3 (moderate): Secondary | ICD-10-CM | POA: Diagnosis not present

## 2016-10-09 DIAGNOSIS — I10 Essential (primary) hypertension: Secondary | ICD-10-CM | POA: Diagnosis not present

## 2016-10-09 DIAGNOSIS — E1165 Type 2 diabetes mellitus with hyperglycemia: Secondary | ICD-10-CM | POA: Diagnosis not present

## 2016-10-09 MED FILL — IPRATROPIUM BR 0.02% SOLN: 0.02 | 10 days supply | Qty: 75 | Fill #0

## 2016-10-09 MED FILL — ALBUTEROL 0.083% INHAL SOLN: (2.5 MG/3ML | 25 days supply | Qty: 300 | Fill #0

## 2016-10-15 DIAGNOSIS — I2699 Other pulmonary embolism without acute cor pulmonale: Secondary | ICD-10-CM | POA: Diagnosis not present

## 2016-10-15 DIAGNOSIS — Z7901 Long term (current) use of anticoagulants: Secondary | ICD-10-CM | POA: Diagnosis not present

## 2016-10-29 DIAGNOSIS — I2699 Other pulmonary embolism without acute cor pulmonale: Secondary | ICD-10-CM | POA: Diagnosis not present

## 2016-10-29 DIAGNOSIS — Z7901 Long term (current) use of anticoagulants: Secondary | ICD-10-CM | POA: Diagnosis not present

## 2016-11-03 DIAGNOSIS — N183 Chronic kidney disease, stage 3 (moderate): Secondary | ICD-10-CM | POA: Diagnosis not present

## 2016-11-05 DIAGNOSIS — D509 Iron deficiency anemia, unspecified: Secondary | ICD-10-CM | POA: Diagnosis not present

## 2016-11-05 DIAGNOSIS — I129 Hypertensive chronic kidney disease with stage 1 through stage 4 chronic kidney disease, or unspecified chronic kidney disease: Secondary | ICD-10-CM | POA: Diagnosis not present

## 2016-11-05 DIAGNOSIS — E876 Hypokalemia: Secondary | ICD-10-CM | POA: Diagnosis not present

## 2016-11-05 DIAGNOSIS — E119 Type 2 diabetes mellitus without complications: Secondary | ICD-10-CM | POA: Diagnosis not present

## 2016-11-05 DIAGNOSIS — I429 Cardiomyopathy, unspecified: Secondary | ICD-10-CM | POA: Diagnosis not present

## 2016-11-05 DIAGNOSIS — I2699 Other pulmonary embolism without acute cor pulmonale: Secondary | ICD-10-CM | POA: Diagnosis not present

## 2016-11-05 DIAGNOSIS — N183 Chronic kidney disease, stage 3 (moderate): Secondary | ICD-10-CM | POA: Diagnosis not present

## 2016-11-05 DIAGNOSIS — Z7901 Long term (current) use of anticoagulants: Secondary | ICD-10-CM | POA: Diagnosis not present

## 2016-11-13 DIAGNOSIS — Z7901 Long term (current) use of anticoagulants: Secondary | ICD-10-CM | POA: Diagnosis not present

## 2016-11-13 DIAGNOSIS — I2699 Other pulmonary embolism without acute cor pulmonale: Secondary | ICD-10-CM | POA: Diagnosis not present

## 2016-11-19 DIAGNOSIS — Z7901 Long term (current) use of anticoagulants: Secondary | ICD-10-CM | POA: Diagnosis not present

## 2016-11-19 DIAGNOSIS — I2699 Other pulmonary embolism without acute cor pulmonale: Secondary | ICD-10-CM | POA: Diagnosis not present

## 2016-11-26 DIAGNOSIS — I2699 Other pulmonary embolism without acute cor pulmonale: Secondary | ICD-10-CM | POA: Diagnosis not present

## 2016-11-26 DIAGNOSIS — Z7901 Long term (current) use of anticoagulants: Secondary | ICD-10-CM | POA: Diagnosis not present

## 2016-12-03 DIAGNOSIS — Z7901 Long term (current) use of anticoagulants: Secondary | ICD-10-CM | POA: Diagnosis not present

## 2016-12-03 DIAGNOSIS — I2699 Other pulmonary embolism without acute cor pulmonale: Secondary | ICD-10-CM | POA: Diagnosis not present

## 2016-12-10 DIAGNOSIS — Z7901 Long term (current) use of anticoagulants: Secondary | ICD-10-CM | POA: Diagnosis not present

## 2016-12-10 DIAGNOSIS — I2699 Other pulmonary embolism without acute cor pulmonale: Secondary | ICD-10-CM | POA: Diagnosis not present

## 2016-12-11 DIAGNOSIS — Z7901 Long term (current) use of anticoagulants: Secondary | ICD-10-CM | POA: Diagnosis not present

## 2016-12-11 DIAGNOSIS — I2699 Other pulmonary embolism without acute cor pulmonale: Secondary | ICD-10-CM | POA: Diagnosis not present

## 2016-12-12 DIAGNOSIS — N359 Urethral stricture, unspecified: Secondary | ICD-10-CM | POA: Diagnosis not present

## 2016-12-17 DIAGNOSIS — Z7901 Long term (current) use of anticoagulants: Secondary | ICD-10-CM | POA: Diagnosis not present

## 2016-12-17 DIAGNOSIS — I2699 Other pulmonary embolism without acute cor pulmonale: Secondary | ICD-10-CM | POA: Diagnosis not present

## 2016-12-24 DIAGNOSIS — Z7901 Long term (current) use of anticoagulants: Secondary | ICD-10-CM | POA: Diagnosis not present

## 2016-12-24 DIAGNOSIS — I2699 Other pulmonary embolism without acute cor pulmonale: Secondary | ICD-10-CM | POA: Diagnosis not present

## 2016-12-26 DIAGNOSIS — C61 Malignant neoplasm of prostate: Secondary | ICD-10-CM | POA: Diagnosis not present

## 2016-12-31 DIAGNOSIS — I2699 Other pulmonary embolism without acute cor pulmonale: Secondary | ICD-10-CM | POA: Diagnosis not present

## 2016-12-31 DIAGNOSIS — Z7901 Long term (current) use of anticoagulants: Secondary | ICD-10-CM | POA: Diagnosis not present

## 2017-01-05 DIAGNOSIS — Z0000 Encounter for general adult medical examination without abnormal findings: Secondary | ICD-10-CM | POA: Diagnosis not present

## 2017-01-05 DIAGNOSIS — E119 Type 2 diabetes mellitus without complications: Secondary | ICD-10-CM | POA: Diagnosis not present

## 2017-01-05 DIAGNOSIS — E785 Hyperlipidemia, unspecified: Secondary | ICD-10-CM | POA: Diagnosis not present

## 2017-01-05 DIAGNOSIS — E559 Vitamin D deficiency, unspecified: Secondary | ICD-10-CM | POA: Diagnosis not present

## 2017-01-05 DIAGNOSIS — D509 Iron deficiency anemia, unspecified: Secondary | ICD-10-CM | POA: Diagnosis not present

## 2017-01-06 DIAGNOSIS — Z95810 Presence of automatic (implantable) cardiac defibrillator: Secondary | ICD-10-CM | POA: Diagnosis not present

## 2017-01-06 DIAGNOSIS — I509 Heart failure, unspecified: Secondary | ICD-10-CM | POA: Diagnosis not present

## 2017-01-07 DIAGNOSIS — I2699 Other pulmonary embolism without acute cor pulmonale: Secondary | ICD-10-CM | POA: Diagnosis not present

## 2017-01-07 DIAGNOSIS — Z7901 Long term (current) use of anticoagulants: Secondary | ICD-10-CM | POA: Diagnosis not present

## 2017-01-14 DIAGNOSIS — I2699 Other pulmonary embolism without acute cor pulmonale: Secondary | ICD-10-CM | POA: Diagnosis not present

## 2017-01-14 DIAGNOSIS — Z7901 Long term (current) use of anticoagulants: Secondary | ICD-10-CM | POA: Diagnosis not present

## 2017-01-21 DIAGNOSIS — I2699 Other pulmonary embolism without acute cor pulmonale: Secondary | ICD-10-CM | POA: Diagnosis not present

## 2017-01-21 DIAGNOSIS — Z7901 Long term (current) use of anticoagulants: Secondary | ICD-10-CM | POA: Diagnosis not present

## 2017-01-27 DIAGNOSIS — N183 Chronic kidney disease, stage 3 (moderate): Secondary | ICD-10-CM | POA: Diagnosis not present

## 2017-01-27 DIAGNOSIS — E876 Hypokalemia: Secondary | ICD-10-CM | POA: Diagnosis not present

## 2017-01-28 DIAGNOSIS — Z7901 Long term (current) use of anticoagulants: Secondary | ICD-10-CM | POA: Diagnosis not present

## 2017-01-28 DIAGNOSIS — I2699 Other pulmonary embolism without acute cor pulmonale: Secondary | ICD-10-CM | POA: Diagnosis not present

## 2017-02-04 DIAGNOSIS — Z7901 Long term (current) use of anticoagulants: Secondary | ICD-10-CM | POA: Diagnosis not present

## 2017-02-04 DIAGNOSIS — I2699 Other pulmonary embolism without acute cor pulmonale: Secondary | ICD-10-CM | POA: Diagnosis not present

## 2017-02-11 DIAGNOSIS — E782 Mixed hyperlipidemia: Secondary | ICD-10-CM | POA: Diagnosis not present

## 2017-02-11 DIAGNOSIS — I2699 Other pulmonary embolism without acute cor pulmonale: Secondary | ICD-10-CM | POA: Diagnosis not present

## 2017-02-11 DIAGNOSIS — N189 Chronic kidney disease, unspecified: Secondary | ICD-10-CM | POA: Diagnosis not present

## 2017-02-11 DIAGNOSIS — Z23 Encounter for immunization: Secondary | ICD-10-CM | POA: Diagnosis not present

## 2017-02-11 DIAGNOSIS — E1122 Type 2 diabetes mellitus with diabetic chronic kidney disease: Secondary | ICD-10-CM | POA: Diagnosis not present

## 2017-02-11 DIAGNOSIS — I129 Hypertensive chronic kidney disease with stage 1 through stage 4 chronic kidney disease, or unspecified chronic kidney disease: Secondary | ICD-10-CM | POA: Diagnosis not present

## 2017-02-11 DIAGNOSIS — Z7901 Long term (current) use of anticoagulants: Secondary | ICD-10-CM | POA: Diagnosis not present

## 2017-02-18 DIAGNOSIS — Z7901 Long term (current) use of anticoagulants: Secondary | ICD-10-CM | POA: Diagnosis not present

## 2017-02-18 DIAGNOSIS — I2699 Other pulmonary embolism without acute cor pulmonale: Secondary | ICD-10-CM | POA: Diagnosis not present

## 2017-02-25 DIAGNOSIS — I2699 Other pulmonary embolism without acute cor pulmonale: Secondary | ICD-10-CM | POA: Diagnosis not present

## 2017-02-25 DIAGNOSIS — Z7901 Long term (current) use of anticoagulants: Secondary | ICD-10-CM | POA: Diagnosis not present

## 2017-03-04 DIAGNOSIS — I2699 Other pulmonary embolism without acute cor pulmonale: Secondary | ICD-10-CM | POA: Diagnosis not present

## 2017-03-04 DIAGNOSIS — Z7901 Long term (current) use of anticoagulants: Secondary | ICD-10-CM | POA: Diagnosis not present

## 2017-03-11 DIAGNOSIS — Z7901 Long term (current) use of anticoagulants: Secondary | ICD-10-CM | POA: Diagnosis not present

## 2017-03-11 DIAGNOSIS — I2699 Other pulmonary embolism without acute cor pulmonale: Secondary | ICD-10-CM | POA: Diagnosis not present

## 2017-03-16 DIAGNOSIS — E1122 Type 2 diabetes mellitus with diabetic chronic kidney disease: Secondary | ICD-10-CM | POA: Diagnosis not present

## 2017-03-16 DIAGNOSIS — I420 Dilated cardiomyopathy: Secondary | ICD-10-CM | POA: Diagnosis not present

## 2017-03-16 DIAGNOSIS — N183 Chronic kidney disease, stage 3 (moderate): Secondary | ICD-10-CM | POA: Diagnosis not present

## 2017-03-16 DIAGNOSIS — Z7901 Long term (current) use of anticoagulants: Secondary | ICD-10-CM | POA: Diagnosis not present

## 2017-03-16 DIAGNOSIS — I4891 Unspecified atrial fibrillation: Secondary | ICD-10-CM | POA: Diagnosis not present

## 2017-03-16 DIAGNOSIS — Z95810 Presence of automatic (implantable) cardiac defibrillator: Secondary | ICD-10-CM | POA: Diagnosis not present

## 2017-03-16 DIAGNOSIS — I5020 Unspecified systolic (congestive) heart failure: Secondary | ICD-10-CM | POA: Diagnosis not present

## 2017-03-16 DIAGNOSIS — I130 Hypertensive heart and chronic kidney disease with heart failure and stage 1 through stage 4 chronic kidney disease, or unspecified chronic kidney disease: Secondary | ICD-10-CM | POA: Diagnosis not present

## 2017-03-18 DIAGNOSIS — I2699 Other pulmonary embolism without acute cor pulmonale: Secondary | ICD-10-CM | POA: Diagnosis not present

## 2017-03-18 DIAGNOSIS — Z7901 Long term (current) use of anticoagulants: Secondary | ICD-10-CM | POA: Diagnosis not present

## 2017-03-25 DIAGNOSIS — Z7901 Long term (current) use of anticoagulants: Secondary | ICD-10-CM | POA: Diagnosis not present

## 2017-03-25 DIAGNOSIS — I2699 Other pulmonary embolism without acute cor pulmonale: Secondary | ICD-10-CM | POA: Diagnosis not present

## 2017-03-31 DIAGNOSIS — R339 Retention of urine, unspecified: Secondary | ICD-10-CM | POA: Diagnosis not present

## 2017-03-31 DIAGNOSIS — C61 Malignant neoplasm of prostate: Secondary | ICD-10-CM | POA: Diagnosis not present

## 2017-04-08 DIAGNOSIS — Z7901 Long term (current) use of anticoagulants: Secondary | ICD-10-CM | POA: Diagnosis not present

## 2017-04-08 DIAGNOSIS — I2699 Other pulmonary embolism without acute cor pulmonale: Secondary | ICD-10-CM | POA: Diagnosis not present

## 2017-04-15 DIAGNOSIS — Z7901 Long term (current) use of anticoagulants: Secondary | ICD-10-CM | POA: Diagnosis not present

## 2017-04-15 DIAGNOSIS — I2699 Other pulmonary embolism without acute cor pulmonale: Secondary | ICD-10-CM | POA: Diagnosis not present

## 2017-04-22 DIAGNOSIS — I2699 Other pulmonary embolism without acute cor pulmonale: Secondary | ICD-10-CM | POA: Diagnosis not present

## 2017-04-22 DIAGNOSIS — Z7901 Long term (current) use of anticoagulants: Secondary | ICD-10-CM | POA: Diagnosis not present

## 2017-04-27 DIAGNOSIS — R062 Wheezing: Secondary | ICD-10-CM | POA: Diagnosis not present

## 2017-04-27 DIAGNOSIS — J40 Bronchitis, not specified as acute or chronic: Secondary | ICD-10-CM | POA: Diagnosis not present

## 2017-05-06 DIAGNOSIS — R05 Cough: Secondary | ICD-10-CM | POA: Diagnosis not present

## 2017-05-06 DIAGNOSIS — I517 Cardiomegaly: Secondary | ICD-10-CM | POA: Diagnosis not present

## 2017-05-06 DIAGNOSIS — R0602 Shortness of breath: Secondary | ICD-10-CM | POA: Diagnosis not present

## 2017-05-06 DIAGNOSIS — J4551 Severe persistent asthma with (acute) exacerbation: Secondary | ICD-10-CM | POA: Diagnosis not present

## 2017-05-06 DIAGNOSIS — R062 Wheezing: Secondary | ICD-10-CM | POA: Diagnosis not present

## 2017-05-07 DIAGNOSIS — I2699 Other pulmonary embolism without acute cor pulmonale: Secondary | ICD-10-CM | POA: Diagnosis not present

## 2017-05-07 DIAGNOSIS — Z7901 Long term (current) use of anticoagulants: Secondary | ICD-10-CM | POA: Diagnosis not present

## 2017-05-13 DIAGNOSIS — I2699 Other pulmonary embolism without acute cor pulmonale: Secondary | ICD-10-CM | POA: Diagnosis not present

## 2017-05-13 DIAGNOSIS — Z7901 Long term (current) use of anticoagulants: Secondary | ICD-10-CM | POA: Diagnosis not present

## 2017-05-15 DIAGNOSIS — Z09 Encounter for follow-up examination after completed treatment for conditions other than malignant neoplasm: Secondary | ICD-10-CM | POA: Diagnosis not present

## 2017-05-15 DIAGNOSIS — J40 Bronchitis, not specified as acute or chronic: Secondary | ICD-10-CM | POA: Diagnosis not present

## 2017-05-27 DIAGNOSIS — Z7901 Long term (current) use of anticoagulants: Secondary | ICD-10-CM | POA: Diagnosis not present

## 2017-05-27 DIAGNOSIS — I2699 Other pulmonary embolism without acute cor pulmonale: Secondary | ICD-10-CM | POA: Diagnosis not present

## 2017-06-03 DIAGNOSIS — I2699 Other pulmonary embolism without acute cor pulmonale: Secondary | ICD-10-CM | POA: Diagnosis not present

## 2017-06-03 DIAGNOSIS — Z7901 Long term (current) use of anticoagulants: Secondary | ICD-10-CM | POA: Diagnosis not present

## 2017-06-10 DIAGNOSIS — I2699 Other pulmonary embolism without acute cor pulmonale: Secondary | ICD-10-CM | POA: Diagnosis not present

## 2017-06-10 DIAGNOSIS — Z7901 Long term (current) use of anticoagulants: Secondary | ICD-10-CM | POA: Diagnosis not present

## 2017-06-24 DIAGNOSIS — Z7901 Long term (current) use of anticoagulants: Secondary | ICD-10-CM | POA: Diagnosis not present

## 2017-06-24 DIAGNOSIS — I2699 Other pulmonary embolism without acute cor pulmonale: Secondary | ICD-10-CM | POA: Diagnosis not present

## 2017-07-01 DIAGNOSIS — Z7901 Long term (current) use of anticoagulants: Secondary | ICD-10-CM | POA: Diagnosis not present

## 2017-07-01 DIAGNOSIS — I2699 Other pulmonary embolism without acute cor pulmonale: Secondary | ICD-10-CM | POA: Diagnosis not present

## 2017-07-08 DIAGNOSIS — Z7901 Long term (current) use of anticoagulants: Secondary | ICD-10-CM | POA: Diagnosis not present

## 2017-07-08 DIAGNOSIS — I2699 Other pulmonary embolism without acute cor pulmonale: Secondary | ICD-10-CM | POA: Diagnosis not present

## 2017-07-15 DIAGNOSIS — I2699 Other pulmonary embolism without acute cor pulmonale: Secondary | ICD-10-CM | POA: Diagnosis not present

## 2017-07-15 DIAGNOSIS — Z7901 Long term (current) use of anticoagulants: Secondary | ICD-10-CM | POA: Diagnosis not present

## 2017-07-17 DIAGNOSIS — C61 Malignant neoplasm of prostate: Secondary | ICD-10-CM | POA: Diagnosis not present

## 2017-07-22 DIAGNOSIS — I2699 Other pulmonary embolism without acute cor pulmonale: Secondary | ICD-10-CM | POA: Diagnosis not present

## 2017-07-22 DIAGNOSIS — Z7901 Long term (current) use of anticoagulants: Secondary | ICD-10-CM | POA: Diagnosis not present

## 2017-07-29 DIAGNOSIS — Z7901 Long term (current) use of anticoagulants: Secondary | ICD-10-CM | POA: Diagnosis not present

## 2017-07-29 DIAGNOSIS — I2699 Other pulmonary embolism without acute cor pulmonale: Secondary | ICD-10-CM | POA: Diagnosis not present

## 2017-08-05 DIAGNOSIS — I2699 Other pulmonary embolism without acute cor pulmonale: Secondary | ICD-10-CM | POA: Diagnosis not present

## 2017-08-05 DIAGNOSIS — Z7901 Long term (current) use of anticoagulants: Secondary | ICD-10-CM | POA: Diagnosis not present

## 2017-08-06 DIAGNOSIS — Z125 Encounter for screening for malignant neoplasm of prostate: Secondary | ICD-10-CM | POA: Diagnosis not present

## 2017-08-06 DIAGNOSIS — E119 Type 2 diabetes mellitus without complications: Secondary | ICD-10-CM | POA: Diagnosis not present

## 2017-08-06 DIAGNOSIS — I10 Essential (primary) hypertension: Secondary | ICD-10-CM | POA: Diagnosis not present

## 2017-08-06 DIAGNOSIS — E785 Hyperlipidemia, unspecified: Secondary | ICD-10-CM | POA: Diagnosis not present

## 2017-08-06 DIAGNOSIS — E559 Vitamin D deficiency, unspecified: Secondary | ICD-10-CM | POA: Diagnosis not present

## 2017-08-11 DIAGNOSIS — Z09 Encounter for follow-up examination after completed treatment for conditions other than malignant neoplasm: Secondary | ICD-10-CM | POA: Diagnosis not present

## 2017-08-11 DIAGNOSIS — E559 Vitamin D deficiency, unspecified: Secondary | ICD-10-CM | POA: Diagnosis not present

## 2017-08-11 DIAGNOSIS — I129 Hypertensive chronic kidney disease with stage 1 through stage 4 chronic kidney disease, or unspecified chronic kidney disease: Secondary | ICD-10-CM | POA: Diagnosis not present

## 2017-08-11 DIAGNOSIS — N183 Chronic kidney disease, stage 3 (moderate): Secondary | ICD-10-CM | POA: Diagnosis not present

## 2017-08-11 DIAGNOSIS — E876 Hypokalemia: Secondary | ICD-10-CM | POA: Diagnosis not present

## 2017-08-11 DIAGNOSIS — I509 Heart failure, unspecified: Secondary | ICD-10-CM | POA: Diagnosis not present

## 2017-08-11 DIAGNOSIS — E1121 Type 2 diabetes mellitus with diabetic nephropathy: Secondary | ICD-10-CM | POA: Diagnosis not present

## 2017-08-11 DIAGNOSIS — E785 Hyperlipidemia, unspecified: Secondary | ICD-10-CM | POA: Diagnosis not present

## 2017-08-11 DIAGNOSIS — I480 Paroxysmal atrial fibrillation: Secondary | ICD-10-CM | POA: Diagnosis not present

## 2017-08-12 DIAGNOSIS — Z7901 Long term (current) use of anticoagulants: Secondary | ICD-10-CM | POA: Diagnosis not present

## 2017-08-12 DIAGNOSIS — I2699 Other pulmonary embolism without acute cor pulmonale: Secondary | ICD-10-CM | POA: Diagnosis not present

## 2017-08-19 DIAGNOSIS — Z7901 Long term (current) use of anticoagulants: Secondary | ICD-10-CM | POA: Diagnosis not present

## 2017-08-19 DIAGNOSIS — I2699 Other pulmonary embolism without acute cor pulmonale: Secondary | ICD-10-CM | POA: Diagnosis not present

## 2017-08-20 DIAGNOSIS — N183 Chronic kidney disease, stage 3 (moderate): Secondary | ICD-10-CM | POA: Diagnosis not present

## 2017-08-26 DIAGNOSIS — Z7901 Long term (current) use of anticoagulants: Secondary | ICD-10-CM | POA: Diagnosis not present

## 2017-08-26 DIAGNOSIS — I2699 Other pulmonary embolism without acute cor pulmonale: Secondary | ICD-10-CM | POA: Diagnosis not present

## 2017-09-02 DIAGNOSIS — Z7901 Long term (current) use of anticoagulants: Secondary | ICD-10-CM | POA: Diagnosis not present

## 2017-09-02 DIAGNOSIS — I2699 Other pulmonary embolism without acute cor pulmonale: Secondary | ICD-10-CM | POA: Diagnosis not present

## 2017-09-08 DIAGNOSIS — I129 Hypertensive chronic kidney disease with stage 1 through stage 4 chronic kidney disease, or unspecified chronic kidney disease: Secondary | ICD-10-CM | POA: Diagnosis not present

## 2017-09-08 DIAGNOSIS — N271 Small kidney, bilateral: Secondary | ICD-10-CM | POA: Diagnosis not present

## 2017-09-08 DIAGNOSIS — I429 Cardiomyopathy, unspecified: Secondary | ICD-10-CM | POA: Diagnosis not present

## 2017-09-08 DIAGNOSIS — N183 Chronic kidney disease, stage 3 (moderate): Secondary | ICD-10-CM | POA: Diagnosis not present

## 2017-09-08 DIAGNOSIS — E119 Type 2 diabetes mellitus without complications: Secondary | ICD-10-CM | POA: Diagnosis not present

## 2017-09-08 DIAGNOSIS — N2581 Secondary hyperparathyroidism of renal origin: Secondary | ICD-10-CM | POA: Diagnosis not present

## 2017-09-08 DIAGNOSIS — E876 Hypokalemia: Secondary | ICD-10-CM | POA: Diagnosis not present

## 2017-09-08 DIAGNOSIS — Z6835 Body mass index (BMI) 35.0-35.9, adult: Secondary | ICD-10-CM | POA: Diagnosis not present

## 2017-09-16 DIAGNOSIS — Z7901 Long term (current) use of anticoagulants: Secondary | ICD-10-CM | POA: Diagnosis not present

## 2017-09-16 DIAGNOSIS — I2699 Other pulmonary embolism without acute cor pulmonale: Secondary | ICD-10-CM | POA: Diagnosis not present

## 2017-09-29 DIAGNOSIS — R079 Chest pain, unspecified: Secondary | ICD-10-CM | POA: Diagnosis not present

## 2017-09-29 DIAGNOSIS — I482 Chronic atrial fibrillation: Secondary | ICD-10-CM | POA: Diagnosis not present

## 2017-09-29 DIAGNOSIS — J4520 Mild intermittent asthma, uncomplicated: Secondary | ICD-10-CM | POA: Diagnosis not present

## 2017-09-29 DIAGNOSIS — I110 Hypertensive heart disease with heart failure: Secondary | ICD-10-CM | POA: Diagnosis not present

## 2017-09-29 DIAGNOSIS — Z7901 Long term (current) use of anticoagulants: Secondary | ICD-10-CM | POA: Diagnosis not present

## 2017-09-29 DIAGNOSIS — I5032 Chronic diastolic (congestive) heart failure: Secondary | ICD-10-CM | POA: Diagnosis not present

## 2017-09-29 DIAGNOSIS — Z95810 Presence of automatic (implantable) cardiac defibrillator: Secondary | ICD-10-CM | POA: Diagnosis not present

## 2017-09-30 DIAGNOSIS — R079 Chest pain, unspecified: Secondary | ICD-10-CM | POA: Diagnosis not present

## 2017-09-30 DIAGNOSIS — Z7901 Long term (current) use of anticoagulants: Secondary | ICD-10-CM | POA: Diagnosis not present

## 2017-09-30 DIAGNOSIS — Z95810 Presence of automatic (implantable) cardiac defibrillator: Secondary | ICD-10-CM | POA: Diagnosis not present

## 2017-09-30 DIAGNOSIS — I2699 Other pulmonary embolism without acute cor pulmonale: Secondary | ICD-10-CM | POA: Diagnosis not present

## 2017-09-30 DIAGNOSIS — I110 Hypertensive heart disease with heart failure: Secondary | ICD-10-CM | POA: Diagnosis not present

## 2017-09-30 DIAGNOSIS — I5032 Chronic diastolic (congestive) heart failure: Secondary | ICD-10-CM | POA: Diagnosis not present

## 2017-09-30 DIAGNOSIS — I482 Chronic atrial fibrillation: Secondary | ICD-10-CM | POA: Diagnosis not present

## 2017-09-30 DIAGNOSIS — J4520 Mild intermittent asthma, uncomplicated: Secondary | ICD-10-CM | POA: Diagnosis not present

## 2017-10-07 DIAGNOSIS — Z7901 Long term (current) use of anticoagulants: Secondary | ICD-10-CM | POA: Diagnosis not present

## 2017-10-07 DIAGNOSIS — I2699 Other pulmonary embolism without acute cor pulmonale: Secondary | ICD-10-CM | POA: Diagnosis not present

## 2017-10-14 DIAGNOSIS — I2699 Other pulmonary embolism without acute cor pulmonale: Secondary | ICD-10-CM | POA: Diagnosis not present

## 2017-10-14 DIAGNOSIS — Z7901 Long term (current) use of anticoagulants: Secondary | ICD-10-CM | POA: Diagnosis not present

## 2017-10-15 DIAGNOSIS — R079 Chest pain, unspecified: Secondary | ICD-10-CM | POA: Diagnosis not present

## 2017-10-16 DIAGNOSIS — C61 Malignant neoplasm of prostate: Secondary | ICD-10-CM | POA: Diagnosis not present

## 2017-10-28 DIAGNOSIS — I2699 Other pulmonary embolism without acute cor pulmonale: Secondary | ICD-10-CM | POA: Diagnosis not present

## 2017-10-28 DIAGNOSIS — Z7901 Long term (current) use of anticoagulants: Secondary | ICD-10-CM | POA: Diagnosis not present

## 2017-11-04 DIAGNOSIS — Z7901 Long term (current) use of anticoagulants: Secondary | ICD-10-CM | POA: Diagnosis not present

## 2017-11-04 DIAGNOSIS — I2699 Other pulmonary embolism without acute cor pulmonale: Secondary | ICD-10-CM | POA: Diagnosis not present

## 2017-11-10 DIAGNOSIS — Z86711 Personal history of pulmonary embolism: Secondary | ICD-10-CM | POA: Diagnosis not present

## 2017-11-10 DIAGNOSIS — E876 Hypokalemia: Secondary | ICD-10-CM | POA: Diagnosis not present

## 2017-11-10 DIAGNOSIS — E1122 Type 2 diabetes mellitus with diabetic chronic kidney disease: Secondary | ICD-10-CM | POA: Diagnosis not present

## 2017-11-10 DIAGNOSIS — I5032 Chronic diastolic (congestive) heart failure: Secondary | ICD-10-CM | POA: Diagnosis not present

## 2017-11-10 DIAGNOSIS — I130 Hypertensive heart and chronic kidney disease with heart failure and stage 1 through stage 4 chronic kidney disease, or unspecified chronic kidney disease: Secondary | ICD-10-CM | POA: Diagnosis not present

## 2017-11-10 DIAGNOSIS — N183 Chronic kidney disease, stage 3 (moderate): Secondary | ICD-10-CM | POA: Diagnosis not present

## 2017-11-10 DIAGNOSIS — Z7901 Long term (current) use of anticoagulants: Secondary | ICD-10-CM | POA: Diagnosis not present

## 2017-11-10 DIAGNOSIS — I429 Cardiomyopathy, unspecified: Secondary | ICD-10-CM | POA: Diagnosis not present

## 2017-11-10 DIAGNOSIS — N2581 Secondary hyperparathyroidism of renal origin: Secondary | ICD-10-CM | POA: Diagnosis not present

## 2017-11-11 DIAGNOSIS — I2699 Other pulmonary embolism without acute cor pulmonale: Secondary | ICD-10-CM | POA: Diagnosis not present

## 2017-11-11 DIAGNOSIS — Z7901 Long term (current) use of anticoagulants: Secondary | ICD-10-CM | POA: Diagnosis not present

## 2017-11-18 DIAGNOSIS — I2699 Other pulmonary embolism without acute cor pulmonale: Secondary | ICD-10-CM | POA: Diagnosis not present

## 2017-11-18 DIAGNOSIS — Z7901 Long term (current) use of anticoagulants: Secondary | ICD-10-CM | POA: Diagnosis not present

## 2017-11-25 DIAGNOSIS — Z7901 Long term (current) use of anticoagulants: Secondary | ICD-10-CM | POA: Diagnosis not present

## 2017-11-25 DIAGNOSIS — I2699 Other pulmonary embolism without acute cor pulmonale: Secondary | ICD-10-CM | POA: Diagnosis not present

## 2017-12-02 DIAGNOSIS — I2699 Other pulmonary embolism without acute cor pulmonale: Secondary | ICD-10-CM | POA: Diagnosis not present

## 2017-12-02 DIAGNOSIS — Z7901 Long term (current) use of anticoagulants: Secondary | ICD-10-CM | POA: Diagnosis not present

## 2017-12-09 DIAGNOSIS — I2699 Other pulmonary embolism without acute cor pulmonale: Secondary | ICD-10-CM | POA: Diagnosis not present

## 2017-12-09 DIAGNOSIS — Z7901 Long term (current) use of anticoagulants: Secondary | ICD-10-CM | POA: Diagnosis not present

## 2017-12-09 DIAGNOSIS — I482 Chronic atrial fibrillation: Secondary | ICD-10-CM | POA: Diagnosis not present

## 2017-12-25 DIAGNOSIS — Z5181 Encounter for therapeutic drug level monitoring: Secondary | ICD-10-CM | POA: Diagnosis not present

## 2017-12-25 DIAGNOSIS — I2699 Other pulmonary embolism without acute cor pulmonale: Secondary | ICD-10-CM | POA: Diagnosis not present

## 2017-12-25 DIAGNOSIS — Z7901 Long term (current) use of anticoagulants: Secondary | ICD-10-CM | POA: Diagnosis not present

## 2018-01-12 DIAGNOSIS — E559 Vitamin D deficiency, unspecified: Secondary | ICD-10-CM | POA: Diagnosis not present

## 2018-01-12 DIAGNOSIS — M109 Gout, unspecified: Secondary | ICD-10-CM | POA: Diagnosis not present

## 2018-01-12 DIAGNOSIS — Z23 Encounter for immunization: Secondary | ICD-10-CM | POA: Diagnosis not present

## 2018-01-12 DIAGNOSIS — E876 Hypokalemia: Secondary | ICD-10-CM | POA: Diagnosis not present

## 2018-01-12 DIAGNOSIS — E7801 Familial hypercholesterolemia: Secondary | ICD-10-CM | POA: Diagnosis not present

## 2018-01-12 DIAGNOSIS — N183 Chronic kidney disease, stage 3 (moderate): Secondary | ICD-10-CM | POA: Diagnosis not present

## 2018-01-12 DIAGNOSIS — E1122 Type 2 diabetes mellitus with diabetic chronic kidney disease: Secondary | ICD-10-CM | POA: Diagnosis not present

## 2018-01-12 DIAGNOSIS — I129 Hypertensive chronic kidney disease with stage 1 through stage 4 chronic kidney disease, or unspecified chronic kidney disease: Secondary | ICD-10-CM | POA: Diagnosis not present

## 2018-01-12 DIAGNOSIS — E1121 Type 2 diabetes mellitus with diabetic nephropathy: Secondary | ICD-10-CM | POA: Diagnosis not present

## 2018-01-19 DIAGNOSIS — E1122 Type 2 diabetes mellitus with diabetic chronic kidney disease: Secondary | ICD-10-CM | POA: Diagnosis not present

## 2018-01-19 DIAGNOSIS — N183 Chronic kidney disease, stage 3 (moderate): Secondary | ICD-10-CM | POA: Diagnosis not present

## 2018-01-19 DIAGNOSIS — E559 Vitamin D deficiency, unspecified: Secondary | ICD-10-CM | POA: Diagnosis not present

## 2018-01-19 DIAGNOSIS — E7801 Familial hypercholesterolemia: Secondary | ICD-10-CM | POA: Diagnosis not present

## 2018-01-19 DIAGNOSIS — I129 Hypertensive chronic kidney disease with stage 1 through stage 4 chronic kidney disease, or unspecified chronic kidney disease: Secondary | ICD-10-CM | POA: Diagnosis not present

## 2018-01-19 DIAGNOSIS — C61 Malignant neoplasm of prostate: Secondary | ICD-10-CM | POA: Diagnosis not present

## 2018-01-19 DIAGNOSIS — E1121 Type 2 diabetes mellitus with diabetic nephropathy: Secondary | ICD-10-CM | POA: Diagnosis not present

## 2018-01-22 DIAGNOSIS — R791 Abnormal coagulation profile: Secondary | ICD-10-CM | POA: Diagnosis not present

## 2018-01-22 DIAGNOSIS — I2699 Other pulmonary embolism without acute cor pulmonale: Secondary | ICD-10-CM | POA: Diagnosis not present

## 2018-01-22 DIAGNOSIS — Z7901 Long term (current) use of anticoagulants: Secondary | ICD-10-CM | POA: Diagnosis not present

## 2018-01-22 DIAGNOSIS — Z5181 Encounter for therapeutic drug level monitoring: Secondary | ICD-10-CM | POA: Diagnosis not present

## 2018-02-10 DIAGNOSIS — N2581 Secondary hyperparathyroidism of renal origin: Secondary | ICD-10-CM | POA: Diagnosis not present

## 2018-02-10 DIAGNOSIS — N183 Chronic kidney disease, stage 3 (moderate): Secondary | ICD-10-CM | POA: Diagnosis not present

## 2018-02-26 DIAGNOSIS — I4819 Other persistent atrial fibrillation: Secondary | ICD-10-CM | POA: Diagnosis not present

## 2018-02-26 DIAGNOSIS — I2699 Other pulmonary embolism without acute cor pulmonale: Secondary | ICD-10-CM | POA: Diagnosis not present

## 2018-02-26 DIAGNOSIS — Z7901 Long term (current) use of anticoagulants: Secondary | ICD-10-CM | POA: Diagnosis not present

## 2018-03-16 DIAGNOSIS — E1122 Type 2 diabetes mellitus with diabetic chronic kidney disease: Secondary | ICD-10-CM | POA: Diagnosis not present

## 2018-03-16 DIAGNOSIS — N183 Chronic kidney disease, stage 3 (moderate): Secondary | ICD-10-CM | POA: Diagnosis not present

## 2018-03-16 DIAGNOSIS — Z86711 Personal history of pulmonary embolism: Secondary | ICD-10-CM | POA: Diagnosis not present

## 2018-03-16 DIAGNOSIS — N2581 Secondary hyperparathyroidism of renal origin: Secondary | ICD-10-CM | POA: Diagnosis not present

## 2018-03-16 DIAGNOSIS — Z7901 Long term (current) use of anticoagulants: Secondary | ICD-10-CM | POA: Diagnosis not present

## 2018-03-16 DIAGNOSIS — I5032 Chronic diastolic (congestive) heart failure: Secondary | ICD-10-CM | POA: Diagnosis not present

## 2018-03-16 DIAGNOSIS — I130 Hypertensive heart and chronic kidney disease with heart failure and stage 1 through stage 4 chronic kidney disease, or unspecified chronic kidney disease: Secondary | ICD-10-CM | POA: Diagnosis not present

## 2018-03-16 DIAGNOSIS — Z95810 Presence of automatic (implantable) cardiac defibrillator: Secondary | ICD-10-CM | POA: Diagnosis not present

## 2018-03-16 DIAGNOSIS — E876 Hypokalemia: Secondary | ICD-10-CM | POA: Diagnosis not present

## 2018-03-17 DIAGNOSIS — Z95810 Presence of automatic (implantable) cardiac defibrillator: Secondary | ICD-10-CM | POA: Diagnosis not present

## 2018-04-02 DIAGNOSIS — I4819 Other persistent atrial fibrillation: Secondary | ICD-10-CM | POA: Diagnosis not present

## 2018-04-02 DIAGNOSIS — Z7901 Long term (current) use of anticoagulants: Secondary | ICD-10-CM | POA: Diagnosis not present

## 2018-04-02 DIAGNOSIS — Z5181 Encounter for therapeutic drug level monitoring: Secondary | ICD-10-CM | POA: Diagnosis not present

## 2018-04-23 DIAGNOSIS — I2699 Other pulmonary embolism without acute cor pulmonale: Secondary | ICD-10-CM | POA: Diagnosis not present

## 2018-04-23 DIAGNOSIS — I429 Cardiomyopathy, unspecified: Secondary | ICD-10-CM | POA: Diagnosis not present

## 2018-04-23 DIAGNOSIS — E1121 Type 2 diabetes mellitus with diabetic nephropathy: Secondary | ICD-10-CM | POA: Diagnosis not present

## 2018-04-23 DIAGNOSIS — I5032 Chronic diastolic (congestive) heart failure: Secondary | ICD-10-CM | POA: Diagnosis not present

## 2018-04-23 DIAGNOSIS — I130 Hypertensive heart and chronic kidney disease with heart failure and stage 1 through stage 4 chronic kidney disease, or unspecified chronic kidney disease: Secondary | ICD-10-CM | POA: Diagnosis not present

## 2018-04-23 DIAGNOSIS — E7801 Familial hypercholesterolemia: Secondary | ICD-10-CM | POA: Diagnosis not present

## 2018-04-23 DIAGNOSIS — N2581 Secondary hyperparathyroidism of renal origin: Secondary | ICD-10-CM | POA: Diagnosis not present

## 2018-04-23 DIAGNOSIS — N183 Chronic kidney disease, stage 3 (moderate): Secondary | ICD-10-CM | POA: Diagnosis not present

## 2018-04-23 DIAGNOSIS — I4819 Other persistent atrial fibrillation: Secondary | ICD-10-CM | POA: Diagnosis not present

## 2018-04-30 DIAGNOSIS — I2699 Other pulmonary embolism without acute cor pulmonale: Secondary | ICD-10-CM | POA: Diagnosis not present

## 2018-04-30 DIAGNOSIS — Z5181 Encounter for therapeutic drug level monitoring: Secondary | ICD-10-CM | POA: Diagnosis not present

## 2018-04-30 DIAGNOSIS — Z7901 Long term (current) use of anticoagulants: Secondary | ICD-10-CM | POA: Diagnosis not present

## 2018-05-05 IMAGING — CR DG CHEST 2V
2 series · 2 of 2 positions shown · non-contrast
Comparison: 01/06/2016 chest radiograph

CLINICAL DATA: 80 y/o  M; cough.

EXAM:
CHEST  2 VIEW

[w chest pa]
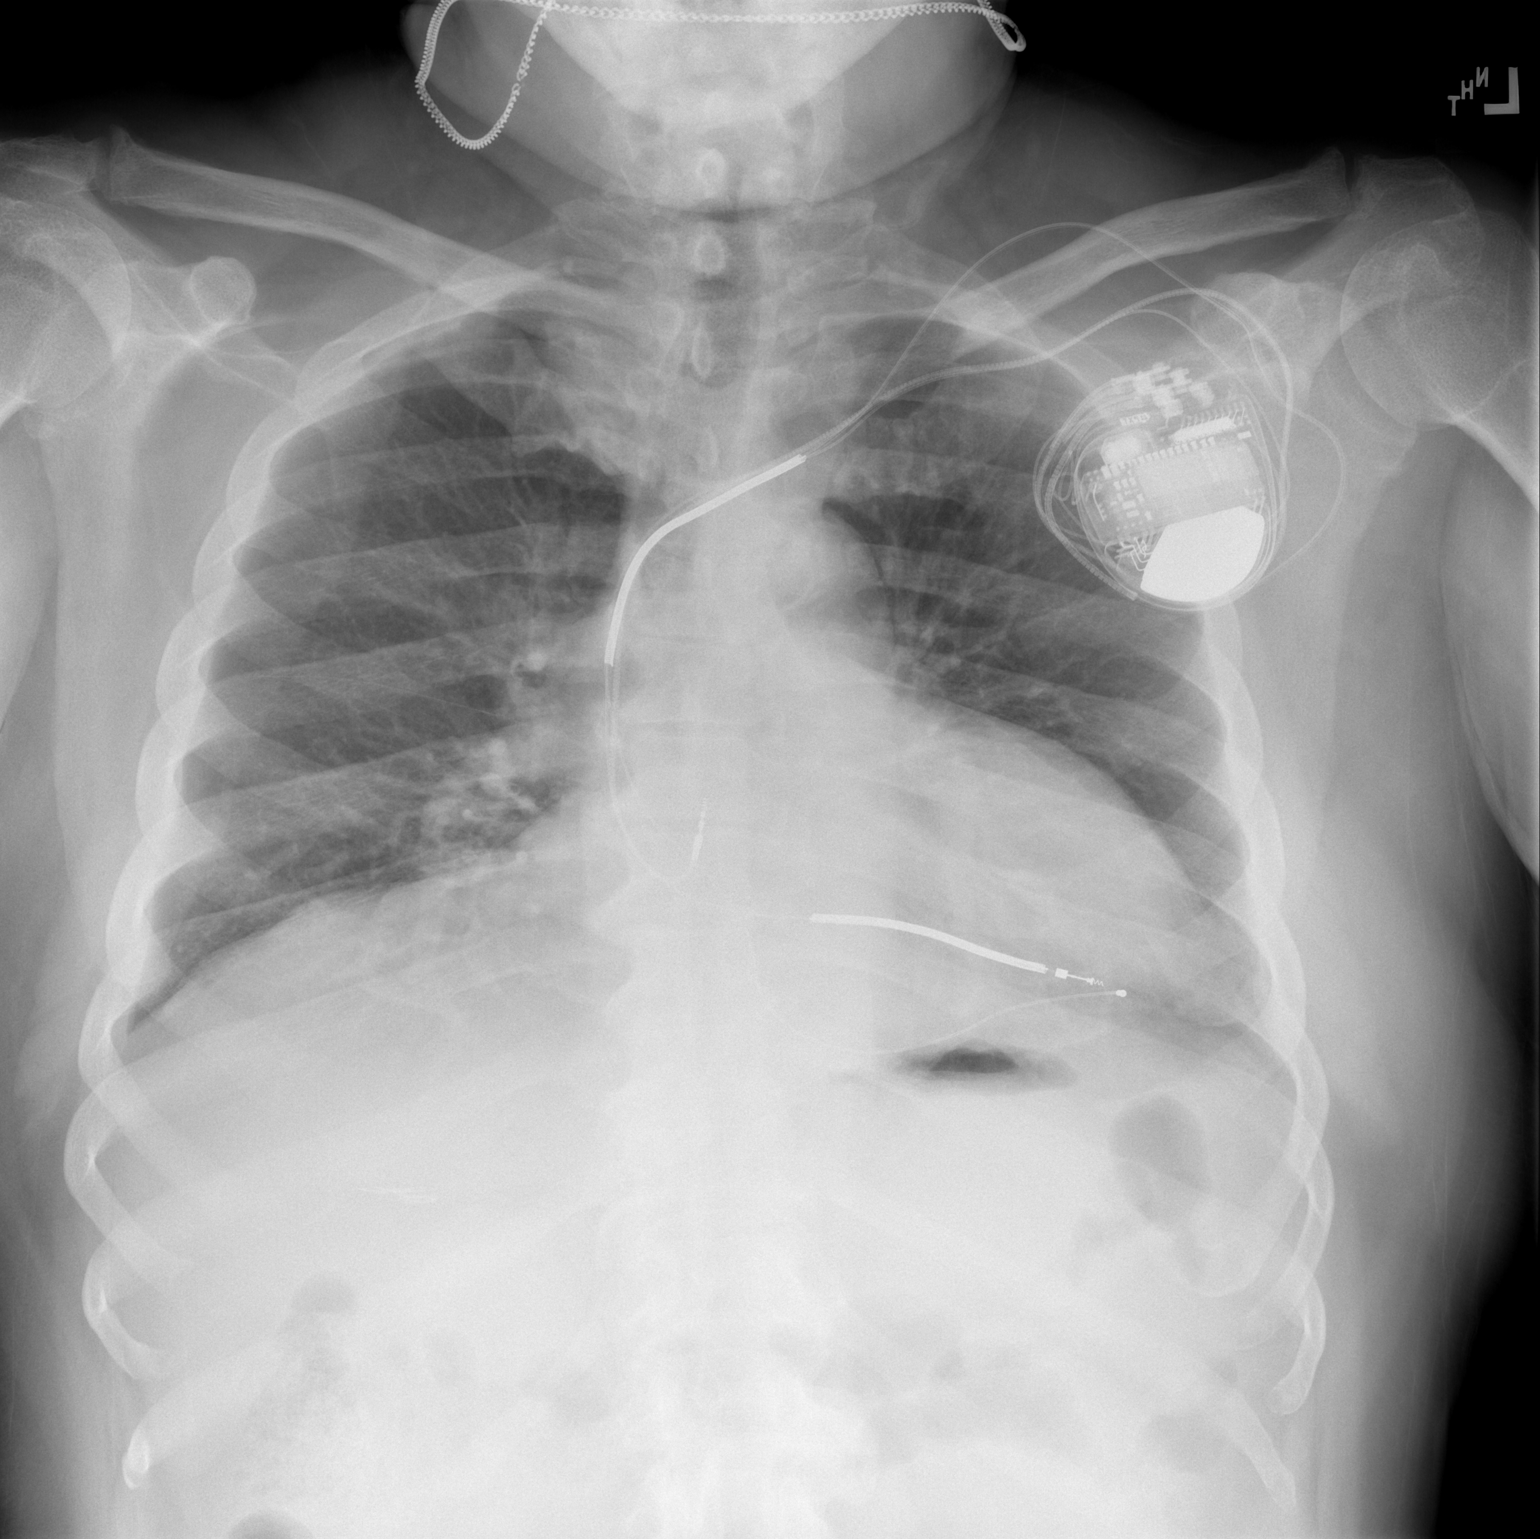

[w chest lat]
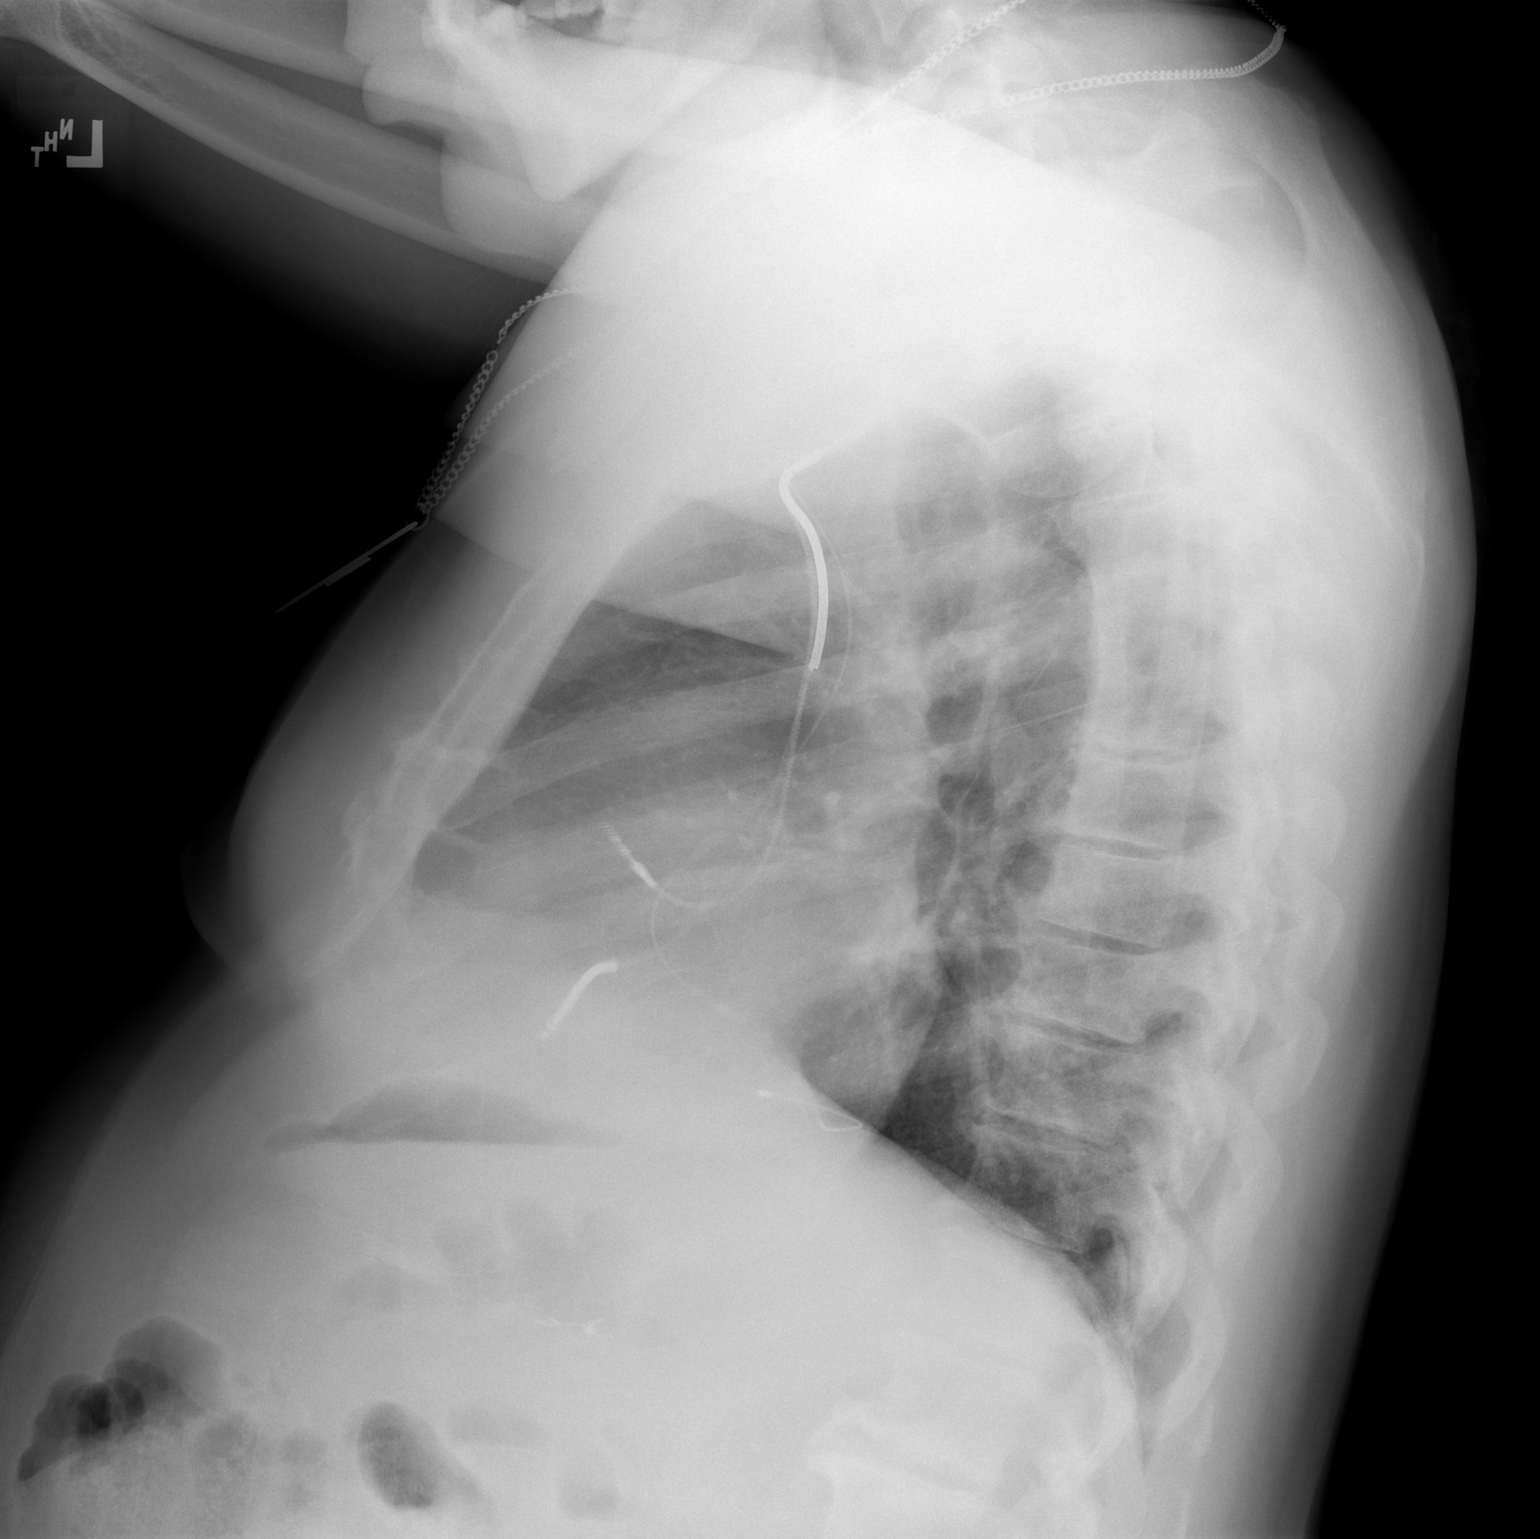

[2 of 2 positions shown; findings below may reference images not displayed]

FINDINGS: Stable mild cardiomegaly. Three lead AICD. Right upper quadrant
surgical clips, presumably cholecystectomy. Low lung volumes
accentuate pulmonary markings. Streaky opacities at lung bases
probably represents minor atelectasis. No consolidation, effusion,
or pneumothorax identified. Multilevel degenerative changes of the
spine.
IMPRESSION: Stable mild cardiomegaly. Low lung volumes. Minor bibasilar
atelectasis.

By: Jhemboy Padam M.D.

## 2018-06-01 DIAGNOSIS — C61 Malignant neoplasm of prostate: Secondary | ICD-10-CM | POA: Diagnosis not present

## 2018-06-04 DIAGNOSIS — I2699 Other pulmonary embolism without acute cor pulmonale: Secondary | ICD-10-CM | POA: Diagnosis not present

## 2018-06-04 DIAGNOSIS — Z5181 Encounter for therapeutic drug level monitoring: Secondary | ICD-10-CM | POA: Diagnosis not present

## 2018-06-04 DIAGNOSIS — I4819 Other persistent atrial fibrillation: Secondary | ICD-10-CM | POA: Diagnosis not present

## 2018-06-04 DIAGNOSIS — Z7901 Long term (current) use of anticoagulants: Secondary | ICD-10-CM | POA: Diagnosis not present

## 2018-06-08 DIAGNOSIS — I10 Essential (primary) hypertension: Secondary | ICD-10-CM | POA: Diagnosis not present

## 2018-06-08 DIAGNOSIS — E782 Mixed hyperlipidemia: Secondary | ICD-10-CM | POA: Diagnosis not present

## 2018-06-10 DIAGNOSIS — D509 Iron deficiency anemia, unspecified: Secondary | ICD-10-CM | POA: Diagnosis not present

## 2018-06-10 DIAGNOSIS — I10 Essential (primary) hypertension: Secondary | ICD-10-CM | POA: Diagnosis not present

## 2018-06-10 DIAGNOSIS — E1121 Type 2 diabetes mellitus with diabetic nephropathy: Secondary | ICD-10-CM | POA: Diagnosis not present

## 2018-07-14 DIAGNOSIS — E1121 Type 2 diabetes mellitus with diabetic nephropathy: Secondary | ICD-10-CM | POA: Diagnosis not present

## 2018-07-14 DIAGNOSIS — N183 Chronic kidney disease, stage 3 (moderate): Secondary | ICD-10-CM | POA: Diagnosis not present

## 2018-07-14 DIAGNOSIS — I129 Hypertensive chronic kidney disease with stage 1 through stage 4 chronic kidney disease, or unspecified chronic kidney disease: Secondary | ICD-10-CM | POA: Diagnosis not present

## 2018-07-23 DIAGNOSIS — Z7901 Long term (current) use of anticoagulants: Secondary | ICD-10-CM | POA: Diagnosis not present

## 2018-07-23 DIAGNOSIS — I2699 Other pulmonary embolism without acute cor pulmonale: Secondary | ICD-10-CM | POA: Diagnosis not present

## 2018-08-04 DIAGNOSIS — Z7901 Long term (current) use of anticoagulants: Secondary | ICD-10-CM | POA: Diagnosis not present

## 2018-08-04 DIAGNOSIS — I2699 Other pulmonary embolism without acute cor pulmonale: Secondary | ICD-10-CM | POA: Diagnosis not present

## 2018-08-04 DIAGNOSIS — Z5181 Encounter for therapeutic drug level monitoring: Secondary | ICD-10-CM | POA: Diagnosis not present

## 2018-09-02 DIAGNOSIS — C61 Malignant neoplasm of prostate: Secondary | ICD-10-CM | POA: Diagnosis not present

## 2018-09-09 DIAGNOSIS — N281 Cyst of kidney, acquired: Secondary | ICD-10-CM | POA: Diagnosis not present

## 2020-07-08 ENCOUNTER — Other Ambulatory Visit: Payer: Self-pay

## 2020-07-08 ENCOUNTER — Emergency Department (HOSPITAL_BASED_OUTPATIENT_CLINIC_OR_DEPARTMENT_OTHER): Payer: Medicare HMO

## 2020-07-08 ENCOUNTER — Emergency Department (HOSPITAL_BASED_OUTPATIENT_CLINIC_OR_DEPARTMENT_OTHER)
Admission: EM | Admit: 2020-07-08 | Discharge: 2020-07-08 | Disposition: A | Discharge status: Home / Self Care | Payer: Medicare HMO | Attending: Emergency Medicine | Admitting: Emergency Medicine

## 2020-07-08 ENCOUNTER — Encounter (HOSPITAL_BASED_OUTPATIENT_CLINIC_OR_DEPARTMENT_OTHER): Payer: Self-pay | Admitting: Emergency Medicine

## 2020-07-08 DIAGNOSIS — Z79899 Other long term (current) drug therapy: Secondary | ICD-10-CM | POA: Diagnosis not present

## 2020-07-08 DIAGNOSIS — Z8546 Personal history of malignant neoplasm of prostate: Secondary | ICD-10-CM | POA: Insufficient documentation

## 2020-07-08 DIAGNOSIS — R079 Chest pain, unspecified: Secondary | ICD-10-CM | POA: Insufficient documentation

## 2020-07-08 DIAGNOSIS — Z7901 Long term (current) use of anticoagulants: Secondary | ICD-10-CM | POA: Insufficient documentation

## 2020-07-08 DIAGNOSIS — I10 Essential (primary) hypertension: Secondary | ICD-10-CM | POA: Insufficient documentation

## 2020-07-08 DIAGNOSIS — R519 Headache, unspecified: Secondary | ICD-10-CM | POA: Insufficient documentation

## 2020-07-08 LAB — CBC WITH DIFFERENTIAL/PLATELET
Abs Immature Granulocytes: 0 10*3/uL (ref 0.00–0.07)
Basophils Absolute: 0 10*3/uL (ref 0.0–0.1)
Basophils Relative: 0 %
Eosinophils Absolute: 0.2 10*3/uL (ref 0.0–0.5)
Eosinophils Relative: 3 %
HCT: 38.2 % — ABNORMAL LOW (ref 39.0–52.0)
Hemoglobin: 11.8 g/dL — ABNORMAL LOW (ref 13.0–17.0)
Lymphocytes Relative: 23 %
Lymphs Abs: 1.4 10*3/uL (ref 0.7–4.0)
MCH: 25.9 pg — ABNORMAL LOW (ref 26.0–34.0)
MCHC: 30.9 g/dL (ref 30.0–36.0)
MCV: 83.8 fL (ref 80.0–100.0)
Monocytes Absolute: 0.8 10*3/uL (ref 0.1–1.0)
Monocytes Relative: 13 %
Neutro Abs: 3.8 10*3/uL (ref 1.7–7.7)
Neutrophils Relative %: 61 %
Platelets: 96 10*3/uL — ABNORMAL LOW (ref 150–400)
RBC: 4.56 MIL/uL (ref 4.22–5.81)
RDW: 15.4 % (ref 11.5–15.5)
WBC: 6.3 10*3/uL (ref 4.0–10.5)
nRBC: 0 % (ref 0.0–0.2)

## 2020-07-08 LAB — BASIC METABOLIC PANEL
Anion gap: 11 (ref 5–15)
BUN: 48 mg/dL — ABNORMAL HIGH (ref 8–23)
CO2: 28 mmol/L (ref 22–32)
Calcium: 9.5 mg/dL (ref 8.9–10.3)
Chloride: 101 mmol/L (ref 98–111)
Creatinine, Ser: 2.14 mg/dL — ABNORMAL HIGH (ref 0.61–1.24)
GFR, Estimated: 30 mL/min — ABNORMAL LOW (ref >60)
Glucose, Bld: 109 mg/dL — ABNORMAL HIGH (ref 70–99)
Potassium: 3.8 mmol/L (ref 3.5–5.1)
Sodium: 140 mmol/L (ref 135–145)

## 2020-07-08 MED ORDER — BUPIVACAINE-EPINEPHRINE (PF) 0.5% -1:200000 IJ SOLN: 30.0000 mL | Freq: Once | INTRAMUSCULAR | Status: DC

## 2020-07-08 MED ORDER — BUPIVACAINE HCL (PF) 0.5 % IJ SOLN
INTRAMUSCULAR | Status: AC
  Administered 2020-07-08: 50 mg
  Filled 2020-07-08: qty 10

## 2020-07-08 MED ORDER — ACETAMINOPHEN 325 MG PO TABS
650.0000 mg | ORAL_TABLET | Freq: Once | ORAL | Status: AC
  Administered 2020-07-08: 650 mg via ORAL
  Filled 2020-07-08: qty 2

## 2020-07-08 NOTE — ED Provider Notes (Signed)
[facility] Provider Note   CSN: [id number] Arrival date & time: 07/08/20  2021     History Chief Complaint  Patient presents with  . Headache  . Chest Pain    Khai Nocera is a 85 y.o. male.  85 year old male who presents emergency department chief complaint of headache.  He said 3 weeks of a right-sided headache which he describes as constant, aching and at times sharp and shooting.  He states that it starts in the back of his neck and radiates around the right side of his head.  He has never had anything like this before.  He denies any other neurologic symptoms such as visual changes, difficulty with speech or swallowing, balance issues.  He called his primary care doctor who called him in an antibiotic for questionable sinus infection.  He denies any rashes, he does have a history of previous neck surgery.  Patient also mentions that he had some "gas pain" after eating today which improved with belching several times.  He has no active pain at this time.  The history is provided by the patient, medical records and the spouse.  Headache Pain location:  Occipital, R parietal, R temporal and frontal Quality:  Dull and sharp Radiates to:  R neck Severity currently:  6/10 Severity at highest:  10/10 Onset quality:  Gradual Duration:  3 weeks Timing:  Constant Progression:  Unchanged Chronicity:  New Similar to prior headaches: no   Context: activity (worse with leaning forward)   Worsened by:  Neck movement Ineffective treatments:  None tried Associated symptoms: no abdominal pain, no back pain, no blurred vision, no congestion, no cough, no diarrhea, no dizziness, no drainage, no ear pain, no eye pain, no facial pain, no fatigue, no fever, no focal weakness, no hearing loss, no loss of balance, no myalgias, no nausea, no near-syncope, no neck pain, no neck stiffness, no numbness, no paresthesias, no photophobia, no seizures, no sinus pressure, no  sore throat, no swollen glands, no syncope, no tingling, no URI, no visual change, no vomiting and no weakness   Chest Pain Associated symptoms: headache   Associated symptoms: no abdominal pain, no back pain, no cough, no dizziness, no fatigue, no fever, no nausea, no near-syncope, no numbness, no syncope, no vomiting and no weakness        Past Medical History:  Diagnosis Date  . Gout   . Heart attack ([facility])   . Hypertension   . Irregular heart rate   . Kidney stones   . Prostate cancer [facility])     Patient Active Problem List   Diagnosis Date Noted  . Left shoulder pain 07/04/2013    Past Surgical History:  Procedure Laterality Date  . CARDIAC DEFIBRILLATOR PLACEMENT    . CARDIAC DEFIBRILLATOR PLACEMENT    . PACEMAKER INSERTION    . SHOULDER SURGERY         Family History  Problem Relation Age of Onset  . Hypertension Mother   . Hypertension Father   . Hypertension Brother     Social History   Tobacco Use  . Smoking status: Never Smoker  . Smokeless tobacco: Never Used  Substance Use Topics  . Alcohol use: No  . Drug use: No    Home Medications Prior to Admission medications   Medication Sig Start Date End Date Taking? Authorizing Provider  albuterol (PROVENTIL HFA;VENTOLIN HFA) 108 (90 BASE) MCG/ACT inhaler Inhale 1-2 puffs into the lungs every 6 (six) hours as  needed for wheezing or shortness of breath. 03/08/14   Debby Freiberg, MD  albuterol (PROVENTIL HFA;VENTOLIN HFA) 108 (90 BASE) MCG/ACT inhaler Inhale 1-2 puffs into the lungs every 6 (six) hours as needed for wheezing or shortness of breath. 05/26/14   Charlesetta Shanks, MD  albuterol (PROVENTIL) (2.5 MG/3ML) 0.083% nebulizer solution Take 3 mLs (2.5 mg total) by nebulization every 4 (four) hours as needed for wheezing or shortness of breath. 05/26/14   Charlesetta Shanks, MD  allopurinol (ZYLOPRIM) 300 MG tablet Take 300 mg by mouth daily.    [provider]  bisacodyl (DULCOLAX) 5 MG EC tablet  Take 5 mg by mouth daily as needed. For constipation    [provider]  cephALEXin (KEFLEX) 250 MG capsule Take 1 capsule (250 mg total) by mouth 4 (four) times daily. 06/30/15   Mabe, Forbes Cellar, MD  colchicine 0.6 MG tablet Take 0.6 mg by mouth 2 (two) times daily.    [provider]  doxazosin (CARDURA) 4 MG tablet Take 4 mg by mouth at bedtime.    [provider]  doxycycline (VIBRAMYCIN) 100 MG capsule Take 1 capsule (100 mg total) by mouth 2 (two) times daily. 08/14/16   Lawyer, Harrell Gave, PA-C  ferrous sulfate 325 (65 FE) MG tablet Take 325 mg by mouth daily with breakfast.    [provider]  fosinopril (MONOPRIL) 20 MG tablet Take 20 mg by mouth daily.    [provider]  gabapentin (NEURONTIN) 300 MG capsule Take 300 mg by mouth 3 (three) times daily.    [provider]  glycerin adult (GLYCERIN ADULT) 2 G SUPP Place 1 suppository rectally daily as needed. For constipation    [provider]  Guaifenesin 1200 MG TB12 Take 1 tablet (1,200 mg total) by mouth 2 (two) times daily. 08/14/16   Lawyer, Harrell Gave, PA-C  hydrochlorothiazide (HYDRODIURIL) 25 MG tablet Take 25 mg by mouth daily.    [provider]  ipratropium (ATROVENT) 0.02 % nebulizer solution Take 2.5 mLs (0.5 mg total) by nebulization 4 (four) times daily. 05/26/14   Charlesetta Shanks, MD  OVER THE COUNTER MEDICATION Take 2-4 capsules by mouth daily with breakfast. "LBS II" (lower bowel stimulant"    [provider]  polyethylene glycol (GOLYTELY) 236 G solution Take 4,000 mLs by mouth once. 03/13/12   Riki Altes, MD  potassium chloride (K-DUR) 10 MEQ tablet Take 10 mEq by mouth daily.    [provider]  predniSONE (DELTASONE) 50 MG tablet Take 1 tablet (50 mg total) by mouth daily with breakfast. 03/08/14   Debby Freiberg, MD  promethazine-dextromethorphan (PROMETHAZINE-DM) 6.25-15 MG/5ML syrup Take 5 mLs by mouth 4 (four) times daily as  needed for cough. 08/14/16   Lawyer, Harrell Gave, PA-C  propranolol (INDERAL) 80 MG tablet  05/19/13   [provider]  torsemide (DEMADEX) 20 MG tablet Take 20 mg by mouth daily.    [provider]  traMADol (ULTRAM) 50 MG tablet Take 1 tablet (50 mg total) by mouth every 6 (six) hours as needed. 06/26/13   Fredia Sorrow, MD  warfarin (COUMADIN) 5 MG tablet Take 7.5 mg by mouth daily.    [provider]    Allergies    Ibuprofen  Review of Systems   Review of Systems  Constitutional: Negative for fatigue and fever.  HENT: Negative for congestion, ear pain, hearing loss, postnasal drip, sinus pressure and sore throat.   Eyes: Negative for blurred vision, photophobia and pain.  Respiratory: Negative  for cough.   Cardiovascular: Positive for chest pain. Negative for syncope and near-syncope.  Gastrointestinal: Negative for abdominal pain, diarrhea, nausea and vomiting.  Musculoskeletal: Negative for back pain, myalgias, neck pain and neck stiffness.  Neurological: Positive for headaches. Negative for dizziness, focal weakness, seizures, weakness, numbness, paresthesias and loss of balance.    Physical Exam Updated Vital Signs BP (!) 147/66   Pulse 65   Temp 98 F (36.7 C) (Oral)   Resp 20   Ht 5' 8.5" (1.74 m)   Wt 95.7 kg   SpO2 100%   BMI 31.62 kg/m   Physical Exam Vitals and nursing note reviewed.  Constitutional:      General: He is not in acute distress.    Appearance: He is well-developed and well-nourished. He is not diaphoretic.  HENT:     Head: Normocephalic and atraumatic.     Mouth/Throat:     Mouth: Oropharynx is clear and moist.  Eyes:     General: No scleral icterus.    Extraocular Movements: EOM normal.     Conjunctiva/sclera: Conjunctivae normal.     Pupils: Pupils are equal, round, and reactive to light.     Comments: No horizontal, vertical or rotational nystagmus  Neck:      Comments: Full active and passive ROM without  pain No midline or paraspinal tenderness No nuchal rigidity or meningeal signs Tenderness at the Greater and lesser occipital nerve grooves Cardiovascular:     Rate and Rhythm: Normal rate and regular rhythm.     Pulses: Intact distal pulses.     Heart sounds: Normal heart sounds.  Pulmonary:     Effort: Pulmonary effort is normal. No respiratory distress.     Breath sounds: Normal breath sounds. No wheezing or rales.  Abdominal:     General: Bowel sounds are normal.     Palpations: Abdomen is soft.     Tenderness: There is no abdominal tenderness. There is no guarding or rebound.  Musculoskeletal:        General: No edema. Normal range of motion.     Cervical back: Normal range of motion and neck supple.  Lymphadenopathy:     Cervical: No cervical adenopathy.  Skin:    General: Skin is warm and dry.     Findings: No rash.  Neurological:     General: No focal deficit present.     Mental Status: He is alert and oriented to person, place, and time.     GCS: GCS eye subscore is 4. GCS verbal subscore is 5. GCS motor subscore is 6.     Cranial Nerves: No cranial nerve deficit.     Motor: No abnormal muscle tone.     Coordination: Coordination normal.     Comments: Mental Status:  Alert, oriented, thought content appropriate. Speech fluent without evidence of aphasia. Able to follow 2 step commands without difficulty.  Cranial Nerves:  II:  Peripheral visual fields grossly normal, pupils equal, round, reactive to light III,IV, VI: ptosis not present, extra-ocular motions intact bilaterally  V,VII: smile symmetric, facial light touch sensation equal VIII: hearing grossly normal bilaterally  IX,X: midline uvula rise  XI: bilateral shoulder shrug equal and strong XII: midline tongue extension  Motor:  5/5 in upper and lower extremities bilaterally including strong and equal grip strength and dorsiflexion/plantar flexion Sensory: Pinprick and light touch normal in all extremities.   Cerebellar: normal finger-to-nose with bilateral upper extremities Gait: normal gait and balance CV: distal pulses palpable throughout  Psychiatric:        Mood and Affect: Mood and affect normal.        Behavior: Behavior normal.        Thought Content: Thought content normal.        Judgment: Judgment normal.     ED Results / Procedures / Treatments   Labs (all labs ordered are listed, but only abnormal results are displayed) Labs Reviewed  BASIC METABOLIC PANEL  CBC WITH DIFFERENTIAL/PLATELET    EKG EKG Interpretation  Date/Time:  Sunday July 08 2020 20:38:44 EST Ventricular Rate:  61 PR Interval:    QRS Duration: 191 QT Interval:  495 QTC Calculation: 499 R Axis:   -89 Text Interpretation: AV dual-paced rhythm No significant change since last tracing Confirmed by Isla Pence [phone]) on 07/08/2020 8:39:45 PM   Radiology No results found.  Procedures .Nerve Block  Date/Time: 07/08/2020 10:26 PM Performed by: Margarita Mail, PA-C Authorized by: Margarita Mail, PA-C   Consent:    Consent obtained:  Verbal   Consent given by:  Patient   Risks, benefits, and alternatives were discussed: yes     Risks discussed:  Allergic reaction, bleeding, infection, swelling, unsuccessful block and pain   Alternatives discussed:  No treatment Universal protocol:    Patient identity confirmed:  Verbally with patient Indications:    Indications:  Pain relief Location:    Body area:  Head   Head nerve:  Greater occipital   Laterality:  Right Pre-procedure details:    Skin preparation:  Chlorhexidine   Preparation: Patient was prepped and draped in usual sterile fashion   Procedure details:    Block needle gauge:  25 G   Anesthetic injected:  Bupivacaine 0.5% w/o epi   Steroid injected:  None   Additive injected:  None   Injection procedure:  Incremental injection, negative aspiration for blood and anatomic landmarks palpated   Paresthesia:  None Post-procedure  details:    Dressing:  Sterile dressing   Outcome:  Pain relieved   Procedure completion:  Tolerated well, no immediate complications     Medications Ordered in ED Medications - No data to display  ED Course  I have reviewed the triage vital signs and the nursing notes.  Pertinent labs & imaging results that were available during my care of the patient were reviewed by me and considered in my medical decision making (see chart for details).    MDM Rules/Calculators/A&P                          Patient with headache. Emergent considerations for headache include subarachnoid hemorrhage, meningitis, temporal arteritis, glaucoma, cerebral ischemia, carotid/vertebral dissection, intracranial tumor, Venous sinus thrombosis, carbon monoxide poisoning, acute or chronic subdural hemorrhage.  Other considerations include: Migraine, Cluster headache, Hypertension, Caffeine, alcohol, or drug withdrawal, Pseudotumor cerebri, Arteriovenous malformation, Head injury, Neurocysticercosis, Post-lumbar puncture, Preeclampsia, Tension headache, Sinusitis, Cervical arthritis, Refractive error causing strain, Dental abscess, Otitis media, Temporomandibular joint syndrome, Depression, Somatoform disorder (eg, somatization) Trigeminal neuralgia, Glossopharyngeal neuralgia. I ordered and reviewed labs CBC which shows no significant abnormalities, mild thrombocytopenia of insignificant value, BMP with chronic renal insufficiency, slightly elevated blood glucose of insignificant value.  I ordered and reviewed head CT and C-spine.  CT images are negative for acute abnormality.  His CT C-spine does show significant degenerative changes.  EKG shows sinus rhythm at a rate of 61.  I performed an occipital nerve block which provided significant relief to the  patient.  This may be cervicogenic versus occipital neuralgia.  Patient is referred to neurology and given ambulatory referral.  I have advised him also to follow-up with  his primary care physician.  He appears otherwise appropriate for discharge without obvious evidence of emergent cause of headache Final Clinical Impression(s) / ED Diagnoses Final diagnoses:  Bad headache    Rx / DC Orders ED Discharge Orders    None       Margarita Mail, PA-C 07/11/20 0930    Isla Pence, MD 07/11/20 2352

## 2020-07-08 NOTE — ED Triage Notes (Signed)
Reports headache for the last three weeks.  Seem previously by PCP.  Was given antibiotics for sinus infection.  Reports headache is no better since finishing them.  Also c/o right chest pain that he feels like is gas that started after eating today.

## 2020-07-08 NOTE — Discharge Instructions (Signed)
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 Please follow up with the neurologist for further evaluation of your headaches. Take tylenol as needed for headache pain. You are having a headache. No specific cause was found today for your headache. It may have been a migraine or other cause of headache. Stress, anxiety, fatigue, and depression are common triggers for headaches. Your headache today does not appear to be life-threatening or require hospitalization, but often the exact cause of headaches is not determined in the emergency department. Therefore, follow-up with your doctor is very important to find out what may have caused your headache, and whether or not you need any further diagnostic testing or treatment. Sometimes headaches can appear benign (not harmful), but then more serious symptoms can develop which should prompt an immediate re-evaluation by your doctor or the emergency department. SEEK MEDICAL ATTENTION IF: You develop possible problems with medications prescribed.  The medications don't resolve your headache, if it recurs , or if you have multiple episodes of vomiting or can't take fluids. You have a change from the usual headache. RETURN IMMEDIATELY IF you develop a sudden, severe headache or confusion, become poorly responsive or faint, develop a fever above 100.77F or problem breathing, have a change in speech, vision, swallowing, or understanding, or develop new weakness, numbness, tingling, incoordination, or have a seizure.

## 2020-07-08 NOTE — ED Notes (Signed)
Pt to CT

## 2020-07-08 NOTE — ED Notes (Signed)
EDP at bedside  

## 2020-09-05 ENCOUNTER — Ambulatory Visit: Payer: Medicare HMO | Admitting: Neurology

## 2021-11-04 ENCOUNTER — Emergency Department (HOSPITAL_BASED_OUTPATIENT_CLINIC_OR_DEPARTMENT_OTHER)
Admission: EM | Admit: 2021-11-04 | Discharge: 2021-11-04 | Disposition: A | Discharge status: Home / Self Care | Payer: Medicare (Managed Care) | Attending: Emergency Medicine | Admitting: Emergency Medicine

## 2021-11-04 ENCOUNTER — Emergency Department (HOSPITAL_BASED_OUTPATIENT_CLINIC_OR_DEPARTMENT_OTHER): Payer: Medicare (Managed Care)

## 2021-11-04 ENCOUNTER — Encounter (HOSPITAL_BASED_OUTPATIENT_CLINIC_OR_DEPARTMENT_OTHER): Payer: Self-pay | Admitting: Emergency Medicine

## 2021-11-04 ENCOUNTER — Other Ambulatory Visit: Payer: Self-pay

## 2021-11-04 DIAGNOSIS — M546 Pain in thoracic spine: Secondary | ICD-10-CM | POA: Insufficient documentation

## 2021-11-04 DIAGNOSIS — M542 Cervicalgia: Secondary | ICD-10-CM | POA: Insufficient documentation

## 2021-11-04 DIAGNOSIS — W01198A Fall on same level from slipping, tripping and stumbling with subsequent striking against other object, initial encounter: Secondary | ICD-10-CM | POA: Diagnosis not present

## 2021-11-04 DIAGNOSIS — R519 Headache, unspecified: Secondary | ICD-10-CM | POA: Insufficient documentation

## 2021-11-04 DIAGNOSIS — Z7901 Long term (current) use of anticoagulants: Secondary | ICD-10-CM | POA: Diagnosis not present

## 2021-11-04 DIAGNOSIS — S0990XA Unspecified injury of head, initial encounter: Secondary | ICD-10-CM

## 2021-11-04 DIAGNOSIS — W19XXXA Unspecified fall, initial encounter: Secondary | ICD-10-CM

## 2021-11-04 LAB — CBC WITH DIFFERENTIAL/PLATELET
Abs Immature Granulocytes: 0.04 10*3/uL (ref 0.00–0.07)
Basophils Absolute: 0 10*3/uL (ref 0.0–0.1)
Basophils Relative: 0 %
Eosinophils Absolute: 0.1 10*3/uL (ref 0.0–0.5)
Eosinophils Relative: 1 %
HCT: 35.7 % — ABNORMAL LOW (ref 39.0–52.0)
Hemoglobin: 11 g/dL — ABNORMAL LOW (ref 13.0–17.0)
Immature Granulocytes: 1 %
Lymphocytes Relative: 12 %
Lymphs Abs: 1 10*3/uL (ref 0.7–4.0)
MCH: 24.8 pg — ABNORMAL LOW (ref 26.0–34.0)
MCHC: 30.8 g/dL (ref 30.0–36.0)
MCV: 80.6 fL (ref 80.0–100.0)
Monocytes Absolute: 0.6 10*3/uL (ref 0.1–1.0)
Monocytes Relative: 7 %
Neutro Abs: 6.7 10*3/uL (ref 1.7–7.7)
Neutrophils Relative %: 79 %
Platelets: 95 10*3/uL — ABNORMAL LOW (ref 150–400)
RBC: 4.43 MIL/uL (ref 4.22–5.81)
RDW: 15.3 % (ref 11.5–15.5)
WBC: 8.3 10*3/uL (ref 4.0–10.5)
nRBC: 0 % (ref 0.0–0.2)

## 2021-11-04 LAB — BASIC METABOLIC PANEL
Anion gap: 10 (ref 5–15)
BUN: 78 mg/dL — ABNORMAL HIGH (ref 8–23)
CO2: 24 mmol/L (ref 22–32)
Calcium: 9.4 mg/dL (ref 8.9–10.3)
Chloride: 101 mmol/L (ref 98–111)
Creatinine, Ser: 2.42 mg/dL — ABNORMAL HIGH (ref 0.61–1.24)
GFR, Estimated: 26 mL/min — ABNORMAL LOW (ref >60)
Glucose, Bld: 186 mg/dL — ABNORMAL HIGH (ref 70–99)
Potassium: 3.7 mmol/L (ref 3.5–5.1)
Sodium: 135 mmol/L (ref 135–145)

## 2021-11-04 LAB — PROTIME-INR
INR: 2.3 — ABNORMAL HIGH (ref 0.8–1.2)
Prothrombin Time: 25 seconds — ABNORMAL HIGH (ref 11.4–15.2)

## 2022-03-30 IMAGING — CT CT HEAD W/O CM
3 series · 15 of 47 positions shown, 18 images · non-contrast
Comparison: December 01, 2013.

CLINICAL DATA: Headache and neck pain.

EXAM:
CT HEAD WITHOUT CONTRAST
CT CERVICAL SPINE WITHOUT CONTRAST
TECHNIQUE: Multidetector CT imaging of the head and cervical spine was
performed following the standard protocol without intravenous
contrast. Multiplanar CT image reconstructions of the cervical spine
were also generated.

[Series 2: head 5.0 h30s · axial · 0.45mm/px · z∈[+742,+867]mm · 9 of 31 slices shown, 12 images]
[im 3/31  brain]
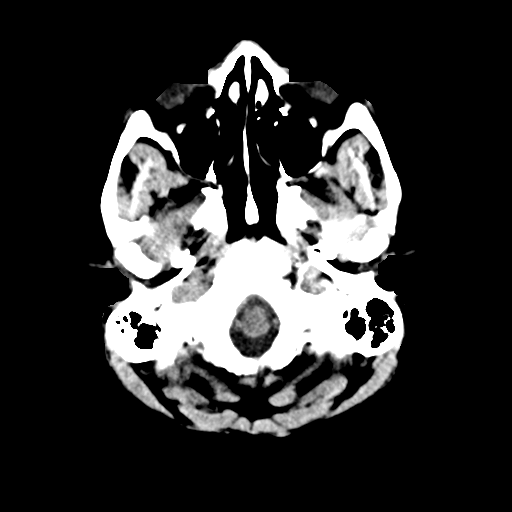
[im 3/31  bone]
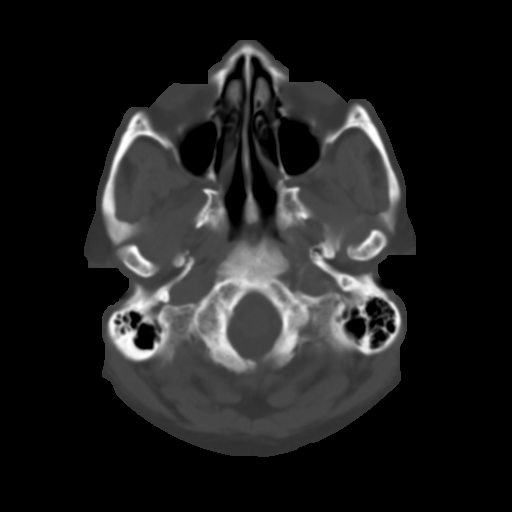
[im 6/31  brain]
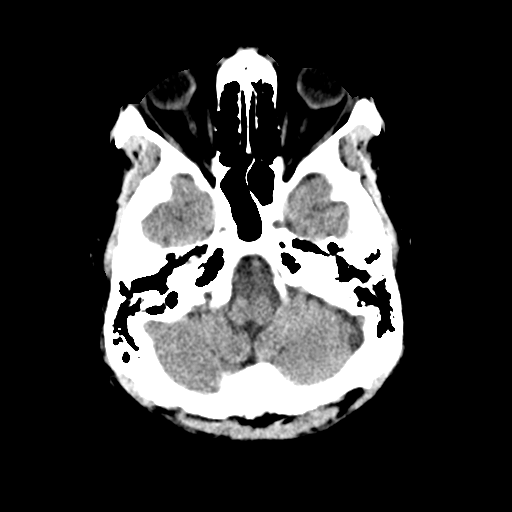
[im 9/31  brain]
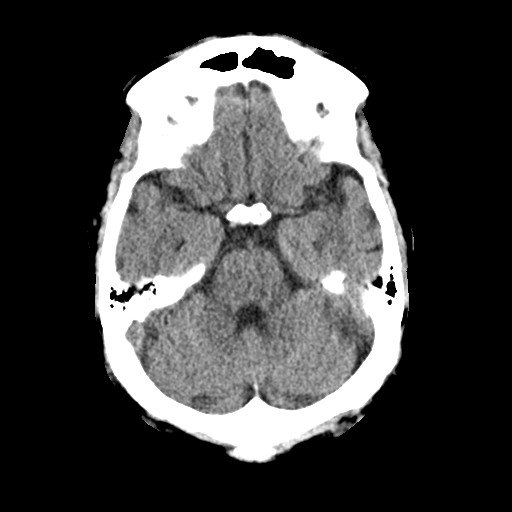
[im 12/31  brain]
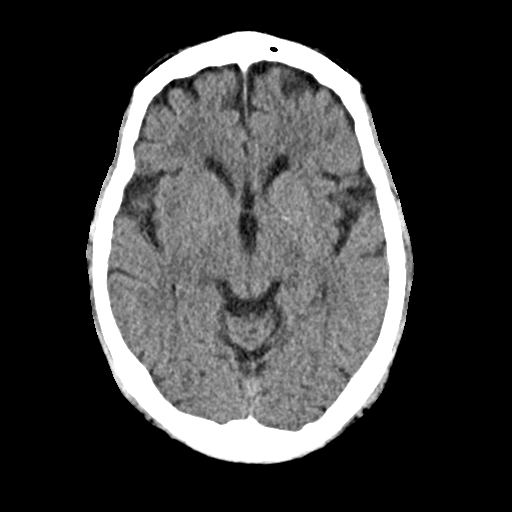
[im 16/31  brain]
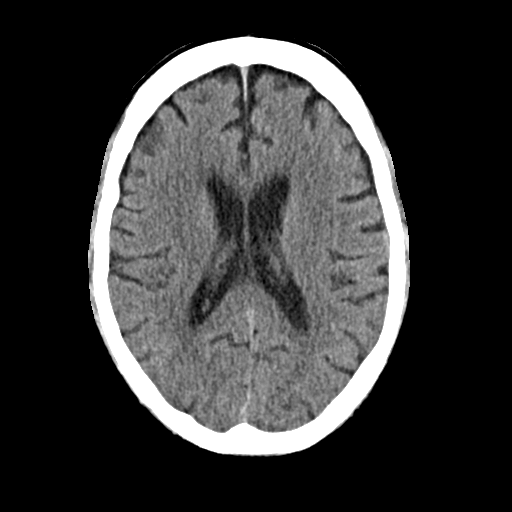
[im 16/31  bone]
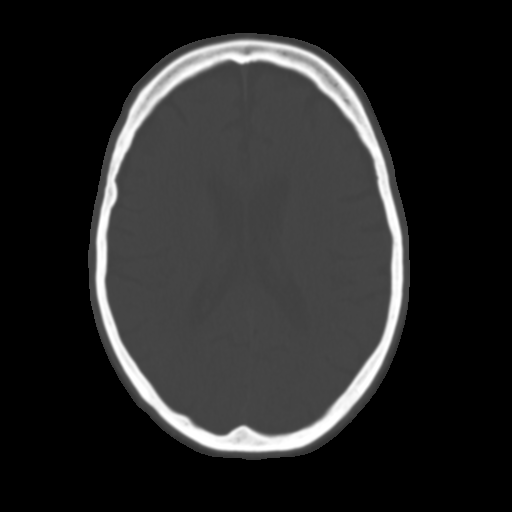
[im 19/31  brain]
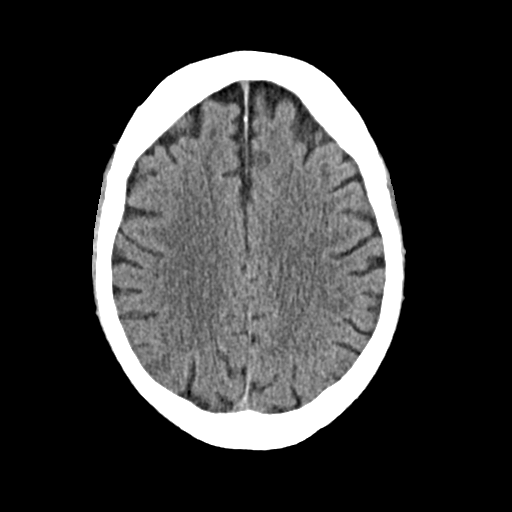
[im 22/31  brain]
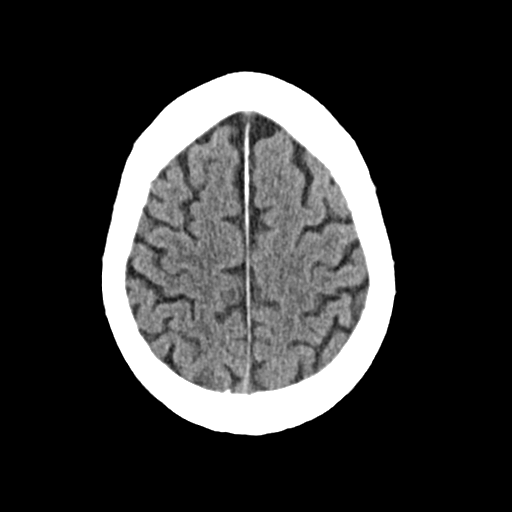
[im 25/31  brain]
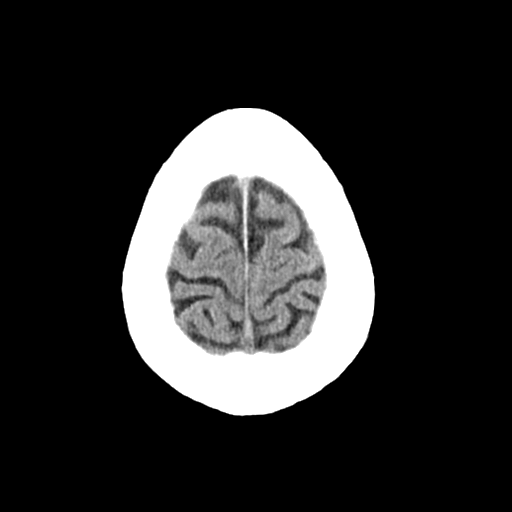
[im 28/31  brain]
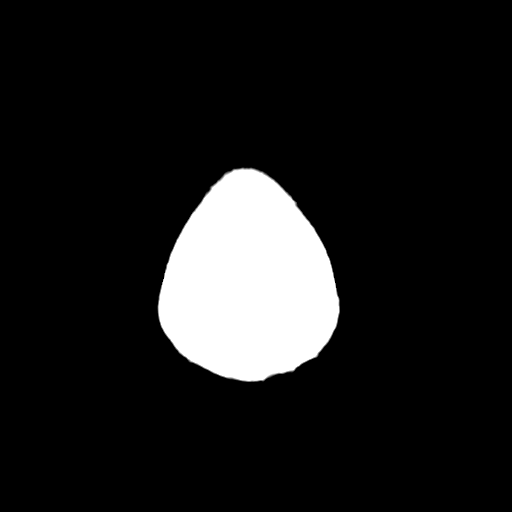
[im 28/31  bone]
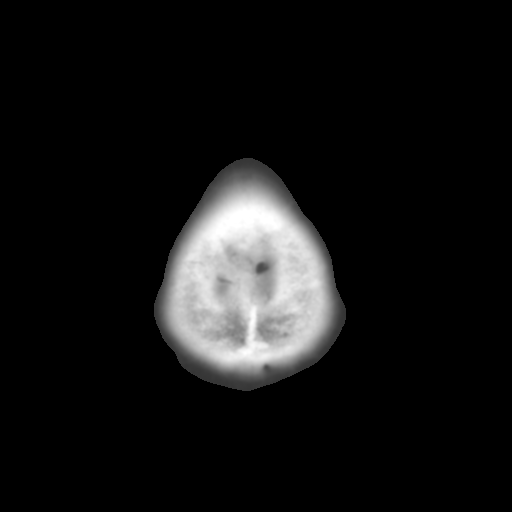

[Series 4: head 3.0 mpr cor · coronal · 0.32mm/px · 3 of 70 slices shown]
[im 24/70  brain]
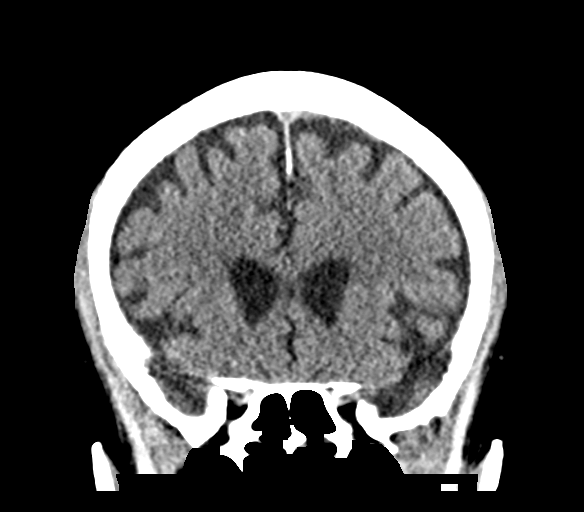
[im 31/70  brain]
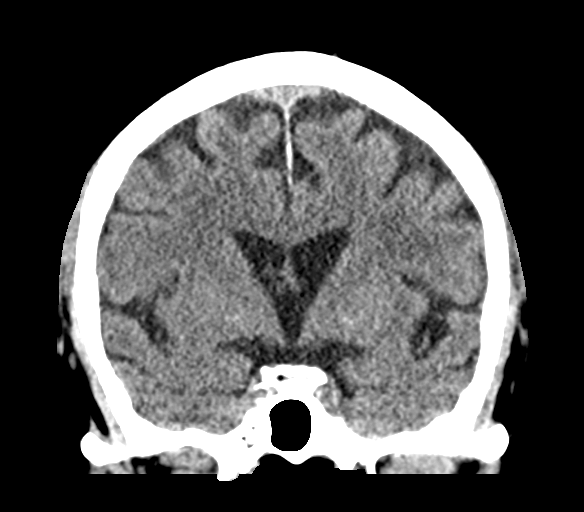
[im 39/70  brain]
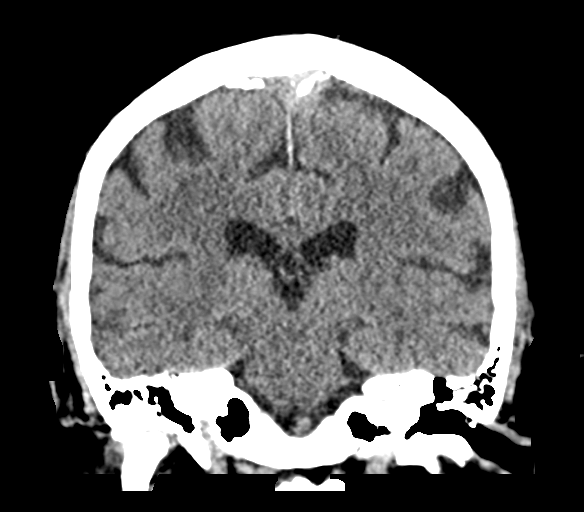

[Series 5: head 3.0 mpr sag · sagittal · 0.31mm/px · 3 of 57 slices shown]
[im 19/57  brain]
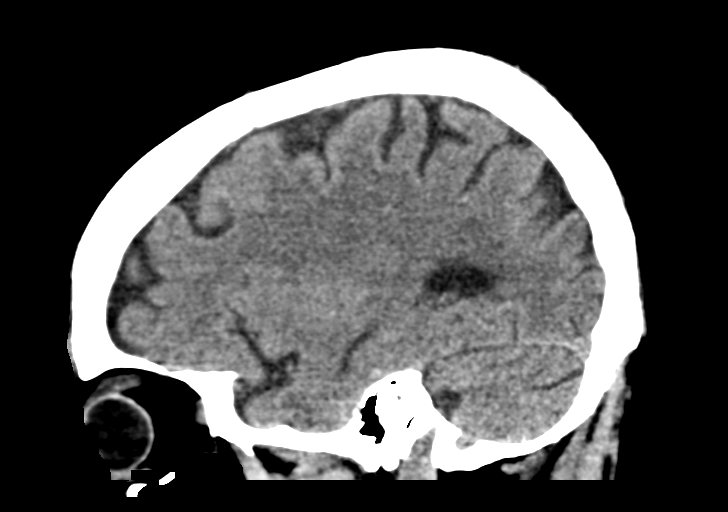
[im 29/57  brain]
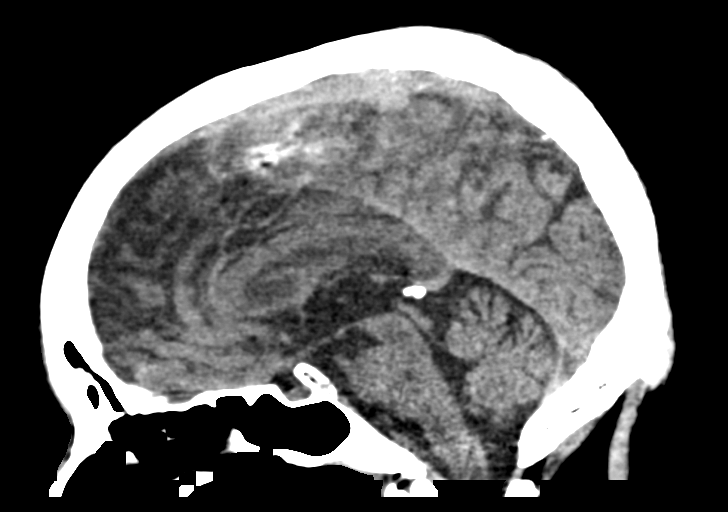
[im 38/57  brain]
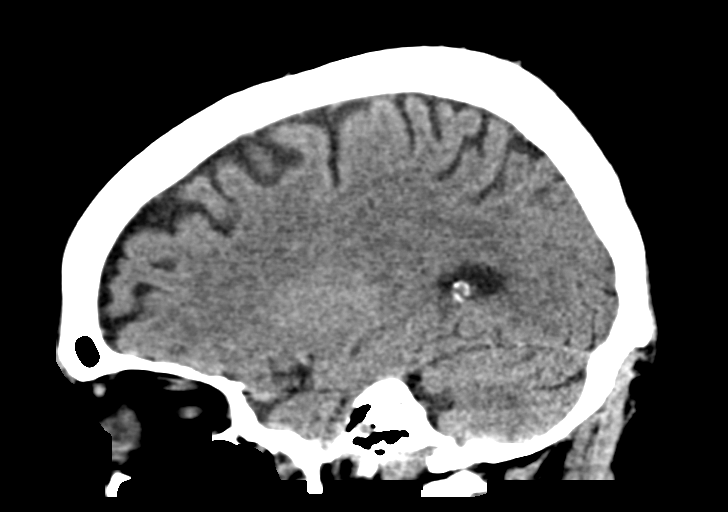

[15 of 47 positions shown; findings below may reference images not displayed]

FINDINGS: CT HEAD FINDINGS

Brain: No evidence of acute infarction, hemorrhage, hydrocephalus,
extra-axial collection or mass lesion/mass effect.

Vascular: No hyperdense vessel or unexpected calcification.

Skull: Normal. Negative for fracture or focal lesion.

Sinuses/Orbits: No acute finding.

Other: None.

CT CERVICAL SPINE FINDINGS

Alignment: Normal.

Skull base and vertebrae: No acute fracture. No primary bone lesion
or focal pathologic process.

Soft tissues and spinal canal: No prevertebral fluid or swelling. No
visible canal hematoma.

Disc levels: Fusion of the C5-6 disc space is noted secondary to
degenerative change. Partial fusion of the C4-5 disc space is noted
due to degenerative change. Anterior osteophyte formation is noted
at C4-5 and C5-6.

Upper chest: Negative.

Other: Fusion of the posterior facet joints of C3-4 and C4-5 is
noted bilaterally secondary to degenerative change.
IMPRESSION: 1. Normal head CT.
2. Multilevel degenerative disc disease is noted in the cervical
spine. No fracture or spondylolisthesis is noted.
